# Patient Record
Sex: Female | Born: 1995 | Race: Asian | Hispanic: No | Marital: Single | Smoking: Never smoker
Health system: Southern US, Community
[De-identification: ages and names within clinical notes are randomized; demographics above are authoritative.]

## PROBLEM LIST (undated history)

## (undated) ENCOUNTER — Inpatient Hospital Stay (HOSPITAL_COMMUNITY): Payer: Self-pay

## (undated) DIAGNOSIS — J45909 Unspecified asthma, uncomplicated: Secondary | ICD-10-CM

## (undated) DIAGNOSIS — O24419 Gestational diabetes mellitus in pregnancy, unspecified control: Secondary | ICD-10-CM

## (undated) HISTORY — PX: NO PAST SURGERIES: SHX2092

## (undated) HISTORY — DX: Gestational diabetes mellitus in pregnancy, unspecified control: O24.419

---

## 2014-06-24 NOTE — L&D Delivery Note (Signed)
Vaginal Delivery Note The pt utilized an epidural as pain management.   Spontaneous rupture of membranes today, at 1000, clear.  GBS was negative.  Cervical dilation was complete at  2004.  NICHD Category 1.    Pushing with guidance began at  2020.   After 1 hour(s) and  12 minutes of pushing the head, shoulders and the body of a viable female infant "Maison" delivered spontaneously in ROP with manual restitution and maternal effort in the to ROT position at 2132  With vigorous tone and spontaneous cry, the infant was placed on moms abd.  After the umbilical cord was clamped it was cut by the FOB, then cord blood was obtained for evaluation.  Spontaneous delivery of a intact placenta with a 3 vessel cord via Shultz at 2146.   Episiotomy: None   The vulva, perineum, vaginal vault, rectum and cervix were inspected and revealed a 1st degree vaginal, repaired using a 3-0 vicryl on a CT needle and a superficial right periurethral laceration and periclitoral which was repaired using a 4-0 vicryl on a SH needle. Lidocaine was not used, the epidural was sufficient for the repair.  Patient tolerated repair well.   Postpartum pitocin as ordered.  Fundus firm, lochia minimum, bleeding under control. EBL 100, Pt hemodynamically stable.   Sponge, laps and needle count correct and verified with the primary care nurse.  Attending MD available at all times.    Routine postpartum orders   Mother unsure about method of contraception  Mom plans to breastfeed   Infant to have out patient circumcision   Placenta to pathology: NO     Cord Gases sent to lab: NO Cord blood sent to lab: YES   APGARS:  8 at 1 minute and 9 at 5 minutes Weight:. pending     Both mom and baby were left in stable condition, baby skin to skin.      Aleese Kamps, CNM, MSN 06/06/2015. 10:39 PM

## 2014-07-11 ENCOUNTER — Encounter (HOSPITAL_COMMUNITY): Payer: Self-pay

## 2014-07-11 ENCOUNTER — Emergency Department (HOSPITAL_COMMUNITY): Payer: BLUE CROSS/BLUE SHIELD

## 2014-07-11 ENCOUNTER — Emergency Department (HOSPITAL_COMMUNITY)
Admission: EM | Admit: 2014-07-11 | Discharge: 2014-07-12 | Disposition: A | Discharge status: Home / Self Care | Payer: BLUE CROSS/BLUE SHIELD | Attending: Emergency Medicine | Admitting: Emergency Medicine

## 2014-07-11 DIAGNOSIS — Z3202 Encounter for pregnancy test, result negative: Secondary | ICD-10-CM | POA: Diagnosis not present

## 2014-07-11 DIAGNOSIS — Y998 Other external cause status: Secondary | ICD-10-CM | POA: Diagnosis not present

## 2014-07-11 DIAGNOSIS — S8991XA Unspecified injury of right lower leg, initial encounter: Secondary | ICD-10-CM | POA: Diagnosis not present

## 2014-07-11 DIAGNOSIS — Y92410 Unspecified street and highway as the place of occurrence of the external cause: Secondary | ICD-10-CM | POA: Insufficient documentation

## 2014-07-11 DIAGNOSIS — J45909 Unspecified asthma, uncomplicated: Secondary | ICD-10-CM | POA: Insufficient documentation

## 2014-07-11 DIAGNOSIS — Y9389 Activity, other specified: Secondary | ICD-10-CM | POA: Insufficient documentation

## 2014-07-11 DIAGNOSIS — S8992XA Unspecified injury of left lower leg, initial encounter: Secondary | ICD-10-CM | POA: Diagnosis not present

## 2014-07-11 HISTORY — DX: Unspecified asthma, uncomplicated: J45.909

## 2014-07-11 LAB — POC URINE PREG, ED: Preg Test, Ur: NEGATIVE

## 2014-07-11 MED ORDER — HYDROCODONE-ACETAMINOPHEN 5-325 MG PO TABS: 2.0000 | ORAL_TABLET | Freq: Four times a day (QID) | ORAL | Status: DC | PRN

## 2014-07-11 MED ORDER — IBUPROFEN 600 MG PO TABS: 600.0000 mg | ORAL_TABLET | Freq: Four times a day (QID) | ORAL | Status: DC | PRN

## 2014-07-11 MED ORDER — IBUPROFEN 200 MG PO TABS
600.0000 mg | ORAL_TABLET | Freq: Once | ORAL | Status: AC
  Administered 2014-07-11: 600 mg via ORAL
  Filled 2014-07-11: qty 3

## 2014-07-11 MED ORDER — HYDROCODONE-ACETAMINOPHEN 5-325 MG PO TABS
2.0000 | ORAL_TABLET | Freq: Once | ORAL | Status: AC
  Administered 2014-07-11: 2 via ORAL
  Filled 2014-07-11: qty 2

## 2014-07-11 NOTE — Discharge Instructions (Signed)
Take motrin 600 mg every 6 hrs for pain.   Take vicodin for severe pain. Do NOT drive with it.   Use crutches for comfort. Apply ice to your leg.   You will be stiff and sore tomorrow. The bruising may get worse.   Follow up with your doctor.   Return to ER if you have worse leg pain, headaches, vomiting.
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
## 2015-02-01 LAB — OB RESULTS CONSOLE ABO/RH
RH Type: NEGATIVE
RH Type: POSITIVE

## 2015-02-01 LAB — OB RESULTS CONSOLE HEPATITIS B SURFACE ANTIGEN: Hepatitis B Surface Ag: NEGATIVE

## 2015-02-01 LAB — OB RESULTS CONSOLE RPR: RPR: NONREACTIVE

## 2015-02-01 LAB — OB RESULTS CONSOLE ANTIBODY SCREEN: ANTIBODY SCREEN: NEGATIVE

## 2015-02-01 LAB — OB RESULTS CONSOLE HIV ANTIBODY (ROUTINE TESTING): HIV: NONREACTIVE

## 2015-02-01 LAB — OB RESULTS CONSOLE RUBELLA ANTIBODY, IGM: RUBELLA: NON-IMMUNE/NOT IMMUNE

## 2015-04-01 ENCOUNTER — Encounter (HOSPITAL_COMMUNITY): Payer: Self-pay | Admitting: *Deleted

## 2015-04-01 ENCOUNTER — Inpatient Hospital Stay (HOSPITAL_COMMUNITY)
Admission: AD | Admit: 2015-04-01 | Discharge: 2015-04-01 | Disposition: A | Discharge status: Home / Self Care | Payer: BLUE CROSS/BLUE SHIELD | Source: Ambulatory Visit | Attending: Obstetrics & Gynecology | Admitting: Obstetrics & Gynecology

## 2015-04-01 DIAGNOSIS — Z3A30 30 weeks gestation of pregnancy: Secondary | ICD-10-CM | POA: Insufficient documentation

## 2015-04-01 DIAGNOSIS — O4703 False labor before 37 completed weeks of gestation, third trimester: Secondary | ICD-10-CM | POA: Insufficient documentation

## 2015-04-01 DIAGNOSIS — Z3483 Encounter for supervision of other normal pregnancy, third trimester: Secondary | ICD-10-CM

## 2015-04-01 LAB — URINALYSIS, ROUTINE W REFLEX MICROSCOPIC
Bilirubin Urine: NEGATIVE
Glucose, UA: NEGATIVE mg/dL
HGB URINE DIPSTICK: NEGATIVE
Ketones, ur: NEGATIVE mg/dL
Leukocytes, UA: NEGATIVE
NITRITE: NEGATIVE
PH: 7 (ref 5.0–8.0)
Protein, ur: NEGATIVE mg/dL
SPECIFIC GRAVITY, URINE: 1.025 (ref 1.005–1.030)
UROBILINOGEN UA: 0.2 mg/dL (ref 0.0–1.0)

## 2015-04-01 NOTE — MAU Provider Note (Signed)
Teresa Brown is a 19 y.o. G1P0 at 30.0 weeks c/o ctx q 2 since 1430. She denies vb or lof w+FM.     History     There are no active problems to display for this patient.   Chief Complaint  Patient presents with  . Abdominal Pain   HPI  OB History    Gravida Para Term Preterm AB TAB SAB Ectopic Multiple Living   1 0        0      Past Medical History  Diagnosis Date  . Asthma     Past Surgical History  Procedure Laterality Date  . No past surgeries      History reviewed. No pertinent family history.  Social History  Substance Use Topics  . Smoking status: Never Smoker   . Smokeless tobacco: None  . Alcohol Use: No    Allergies: No Known Allergies  Prescriptions prior to admission  Medication Sig Dispense Refill Last Dose  . Prenatal Vit-Fe Fumarate-FA (PRENATAL MULTIVITAMIN) TABS tablet Take 1 tablet by mouth daily.   Past Week at Unknown time    ROS See HPI above, all other systems are negative  Physical Exam   Blood pressure 115/63, pulse 77, temperature 98 F (36.7 C), temperature source Oral, resp. rate 16, height  (1.575 m), weight 153 lb (69.4 kg), last menstrual period 09/03/2014.  Physical Exam Ext:  WNL ABD: Soft, non tender to palpation, no rebound or guarding SVE:   ED Course  Assessment: IUP at  30.0weeks Membranes: intact FHR: Category 1 CTX:  none 1 hr observation  MAU Addendum Note  Results for orders placed or performed during the hospital encounter of 04/01/15 (from the past 24 hour(s))  Urinalysis, Routine w reflex microscopic (not at [facility])     Status: None   Collection Time: 04/01/15  4:20 PM  Result Value Ref Range   Color, Urine YELLOW YELLOW   APPearance CLEAR CLEAR   Specific Gravity, Urine 1.025 1.005 - 1.030   pH 7.0 5.0 - 8.0   Glucose, UA NEGATIVE NEGATIVE mg/dL   Hgb urine dipstick NEGATIVE NEGATIVE   Bilirubin Urine NEGATIVE NEGATIVE   Ketones, ur NEGATIVE NEGATIVE mg/dL   Protein, ur NEGATIVE NEGATIVE  mg/dL   Urobilinogen, UA 0.2 0.0 - 1.0 mg/dL   Nitrite NEGATIVE NEGATIVE   Leukocytes, UA NEGATIVE NEGATIVE   Braxton hick reviewed   Plan: -Discussed need to follow up in office  -Bleeding and PTL Precautions -Encouraged to call if any questions or concerns arise prior to next scheduled office visit.  -Discharged to home in stable condition Braxton hicks   Evola Hollis, CNM, MSN 04/01/2015. 5:43 PM

## 2015-04-01 NOTE — MAU Note (Signed)
Patient presents at [redacted] weeks gestation with c/o abdominal cramping every 2 minutes since 1430. Fetus active. Denies bleeding or discharge.

## 2015-04-01 NOTE — Discharge Instructions (Signed)
Fetal Movement Counts Patient Name: __________________________________________________ Patient Due Date: ____________________ Performing a fetal movement count is highly recommended in high-risk pregnancies, but it is good for every pregnant woman to do. Your health care provider may ask you to start counting fetal movements at 28 weeks of the pregnancy. Fetal movements often increase:  After eating a full meal.  After physical activity.  After eating or drinking something sweet or cold.  At rest. Pay attention to when you feel the baby is most active. This will help you notice a pattern of your baby's sleep and wake cycles and what factors contribute to an increase in fetal movement. It is important to perform a fetal movement count at the same time each day when your baby is normally most active.  HOW TO COUNT FETAL MOVEMENTS 1. Find a quiet and comfortable area to sit or lie down on your left side. Lying on your left side provides the best blood and oxygen circulation to your baby. 2. Write down the day and time on a sheet of paper or in a journal. 3. Start counting kicks, flutters, swishes, rolls, or jabs in a 2-hour period. You should feel at least 10 movements within 2 hours. 4. If you do not feel 10 movements in 2 hours, wait 2-3 hours and count again. Look for a change in the pattern or not enough counts in 2 hours. SEEK MEDICAL CARE IF:  You feel less than 10 counts in 2 hours, tried twice.  There is no movement in over an hour.  The pattern is changing or taking longer each day to reach 10 counts in 2 hours.  You feel the baby is not moving as he or she usually does. Date: ____________ Movements: ____________ Start time: ____________ Finish time: ____________  Date: ____________ Movements: ____________ Start time: ____________ Finish time: ____________ Date: ____________ Movements: ____________ Start time: ____________ Finish time: ____________ Date: ____________ Movements:  ____________ Start time: ____________ Finish time: ____________ Date: ____________ Movements: ____________ Start time: ____________ Finish time: ____________ Date: ____________ Movements: ____________ Start time: ____________ Finish time: ____________ Date: ____________ Movements: ____________ Start time: ____________ Finish time: ____________ Date: ____________ Movements: ____________ Start time: ____________ Finish time: ____________  Date: ____________ Movements: ____________ Start time: ____________ Finish time: ____________ Date: ____________ Movements: ____________ Start time: ____________ Finish time: ____________ Date: ____________ Movements: ____________ Start time: ____________ Finish time: ____________ Date: ____________ Movements: ____________ Start time: ____________ Finish time: ____________ Date: ____________ Movements: ____________ Start time: ____________ Finish time: ____________ Date: ____________ Movements: ____________ Start time: ____________ Finish time: ____________ Date: ____________ Movements: ____________ Start time: ____________ Finish time: ____________  Date: ____________ Movements: ____________ Start time: ____________ Finish time: ____________ Date: ____________ Movements: ____________ Start time: ____________ Finish time: ____________ Date: ____________ Movements: ____________ Start time: ____________ Finish time: ____________ Date: ____________ Movements: ____________ Start time: ____________ Finish time: ____________ Date: ____________ Movements: ____________ Start time: ____________ Finish time: ____________ Date: ____________ Movements: ____________ Start time: ____________ Finish time: ____________ Date: ____________ Movements: ____________ Start time: ____________ Finish time: ____________  Date: ____________ Movements: ____________ Start time: ____________ Finish time: ____________ Date: ____________ Movements: ____________ Start time: ____________ Finish  time: ____________ Date: ____________ Movements: ____________ Start time: ____________ Finish time: ____________ Date: ____________ Movements: ____________ Start time: ____________ Finish time: ____________ Date: ____________ Movements: ____________ Start time: ____________ Finish time: ____________ Date: ____________ Movements: ____________ Start time: ____________ Finish time: ____________ Date: ____________ Movements: ____________ Start time: ____________ Finish time: ____________  Date: ____________ Movements: ____________ Start time: ____________ Finish   time: ____________ Date: ____________ Movements: ____________ Start time: ____________ Finish time: ____________ Date: ____________ Movements: ____________ Start time: ____________ Finish time: ____________ Date: ____________ Movements: ____________ Start time: ____________ Finish time: ____________ Date: ____________ Movements: ____________ Start time: ____________ Finish time: ____________ Date: ____________ Movements: ____________ Start time: ____________ Finish time: ____________ Date: ____________ Movements: ____________ Start time: ____________ Finish time: ____________  Date: ____________ Movements: ____________ Start time: ____________ Finish time: ____________ Date: ____________ Movements: ____________ Start time: ____________ Finish time: ____________ Date: ____________ Movements: ____________ Start time: ____________ Finish time: ____________ Date: ____________ Movements: ____________ Start time: ____________ Finish time: ____________ Date: ____________ Movements: ____________ Start time: ____________ Finish time: ____________ Date: ____________ Movements: ____________ Start time: ____________ Finish time: ____________ Date: ____________ Movements: ____________ Start time: ____________ Finish time: ____________  Date: ____________ Movements: ____________ Start time: ____________ Finish time: ____________ Date: ____________  Movements: ____________ Start time: ____________ Finish time: ____________ Date: ____________ Movements: ____________ Start time: ____________ Finish time: ____________ Date: ____________ Movements: ____________ Start time: ____________ Finish time: ____________ Date: ____________ Movements: ____________ Start time: ____________ Finish time: ____________ Date: ____________ Movements: ____________ Start time: ____________ Finish time: ____________ Date: ____________ Movements: ____________ Start time: ____________ Finish time: ____________  Date: ____________ Movements: ____________ Start time: ____________ Finish time: ____________ Date: ____________ Movements: ____________ Start time: ____________ Finish time: ____________ Date: ____________ Movements: ____________ Start time: ____________ Finish time: ____________ Date: ____________ Movements: ____________ Start time: ____________ Finish time: ____________ Date: ____________ Movements: ____________ Start time: ____________ Finish time: ____________ Date: ____________ Movements: ____________ Start time: ____________ Finish time: ____________   This information is not intended to replace advice given to you by your health care provider. Make sure you discuss any questions you have with your health care provider.   Document Released: 07/10/2006 Document Revised: 07/01/2014 Document Reviewed: 04/06/2012 Elsevier Interactive Patient Education 2016 [employer] Braxton Hicks Contractions Contractions of the uterus can occur throughout pregnancy. Contractions are not always a sign that you are in labor.  WHAT ARE BRAXTON HICKS CONTRACTIONS?  Contractions that occur before labor are called Braxton Hicks contractions, or false labor. Toward the end of pregnancy (32-34 weeks), these contractions can develop more often and may become more forceful. This is not true labor because these contractions do not result in opening (dilatation) and thinning of  the cervix. They are sometimes difficult to tell apart from true labor because these contractions can be forceful and people have different pain tolerances. You should not feel embarrassed if you go to the hospital with false labor. Sometimes, the only way to tell if you are in true labor is for your health care provider to look for changes in the cervix. If there are no prenatal problems or other health problems associated with the pregnancy, it is completely safe to be sent home with false labor and await the onset of true labor. HOW CAN YOU TELL THE DIFFERENCE BETWEEN TRUE AND FALSE LABOR? False Labor  The contractions of false labor are usually shorter and not as hard as those of true labor.   The contractions are usually irregular.   The contractions are often felt in the front of the lower abdomen and in the groin.   The contractions may go away when you walk around or change positions while lying down.   The contractions get weaker and are shorter lasting as time goes on.   The contractions do not usually become progressively stronger, regular, and closer together as with true labor.  True Labor 5. Contractions in true   labor last 30-70 seconds, become very regular, usually become more intense, and increase in frequency.  6. The contractions do not go away with walking.  7. The discomfort is usually felt in the top of the uterus and spreads to the lower abdomen and low back.  8. True labor can be determined by your health care provider with an exam. This will show that the cervix is dilating and getting thinner.  WHAT TO REMEMBER  Keep up with your usual exercises and follow other instructions given by your health care provider.   Take medicines as directed by your health care provider.   Keep your regular prenatal appointments.   Eat and drink lightly if you think you are going into labor.   If Braxton Hicks contractions are making you uncomfortable:   Change  your position from lying down or resting to walking, or from walking to resting.   Sit and rest in a tub of warm water.   Drink 2-3 glasses of water. Dehydration may cause these contractions.   Do slow and deep breathing several times an hour.  WHEN SHOULD I SEEK IMMEDIATE MEDICAL CARE? Seek immediate medical care if:  Your contractions become stronger, more regular, and closer together.   You have fluid leaking or gushing from your vagina.   You have a fever.   You pass blood-tinged mucus.   You have vaginal bleeding.   You have continuous abdominal pain.   You have low back pain that you never had before.   You feel your baby's head pushing down and causing pelvic pressure.   Your baby is not moving as much as it used to.    This information is not intended to replace advice given to you by your health care provider. Make sure you discuss any questions you have with your health care provider.   Document Released: 06/10/2005 Document Revised: 06/15/2013 Document Reviewed: 03/22/2013 Elsevier Interactive Patient Education 2016 [employer] Inc[employer]  

## 2015-04-05 ENCOUNTER — Encounter: Payer: BLUE CROSS/BLUE SHIELD | Attending: Family Medicine

## 2015-04-05 VITALS — Ht 62.0 in | Wt 157.8 lb

## 2015-04-05 DIAGNOSIS — O24419 Gestational diabetes mellitus in pregnancy, unspecified control: Secondary | ICD-10-CM | POA: Diagnosis not present

## 2015-04-05 DIAGNOSIS — Z713 Dietary counseling and surveillance: Secondary | ICD-10-CM | POA: Insufficient documentation

## 2015-04-11 NOTE — Progress Notes (Signed)
  Patient was seen on 04/05/15 for Gestational Diabetes self-management . The following learning objectives were met by the patient :   States the definition of Gestational Diabetes  States why dietary management is important in controlling blood glucose  Describes the effects of carbohydrates on blood glucose levels  Demonstrates ability to create a balanced meal plan  Demonstrates carbohydrate counting   States when to check blood glucose levels  Demonstrates proper blood glucose monitoring techniques  States the effect of stress and exercise on blood glucose levels  States the importance of limiting caffeine and abstaining from alcohol and smoking  Plan:  Aim for 2 Carb Choices per meal (30 grams) +/- 1 either way for breakfast Aim for 3 Carb Choices per meal (45 grams) +/- 1 either way from lunch and dinner Aim for 1-2 Carbs per snack Begin reading food labels for Total Carbohydrate and sugar grams of foods Consider  increasing your activity level by walking daily as tolerated Begin checking BG before breakfast and 1-2 hours after first bit of breakfast, lunch and dinner after  as directed by MD  Take medication  as directed by MD  Blood glucose monitor given: One Touch Verio Flex Lot # [id number] X Exp: 05/2016 Blood glucose reading: $RemoveBeforeDE'86mg'[id number]$ /dl  Patient instructed to monitor glucose levels: FBS: 60 - <90 1 hour: <140 2 hour: <120  Patient received the following handouts:  Nutrition Diabetes and Pregnancy  Carbohydrate Counting List  Meal Planning worksheet  Patient will be seen for follow-up as needed.

## 2015-05-23 LAB — OB RESULTS CONSOLE GBS: GBS: NEGATIVE

## 2015-06-06 ENCOUNTER — Inpatient Hospital Stay (HOSPITAL_COMMUNITY): Payer: Medicaid Other | Admitting: Anesthesiology

## 2015-06-06 ENCOUNTER — Inpatient Hospital Stay (HOSPITAL_COMMUNITY)
Admission: AD | Admit: 2015-06-06 | Discharge: 2015-06-09 | DRG: 774 | Disposition: A | Discharge status: Home / Self Care | Payer: Medicaid Other | Source: Ambulatory Visit | Attending: Obstetrics and Gynecology | Admitting: Obstetrics and Gynecology

## 2015-06-06 ENCOUNTER — Encounter (HOSPITAL_COMMUNITY): Payer: Self-pay

## 2015-06-06 DIAGNOSIS — Z3A39 39 weeks gestation of pregnancy: Secondary | ICD-10-CM

## 2015-06-06 DIAGNOSIS — D649 Anemia, unspecified: Secondary | ICD-10-CM | POA: Diagnosis present

## 2015-06-06 DIAGNOSIS — Z823 Family history of stroke: Secondary | ICD-10-CM

## 2015-06-06 DIAGNOSIS — O24419 Gestational diabetes mellitus in pregnancy, unspecified control: Secondary | ICD-10-CM | POA: Diagnosis present

## 2015-06-06 DIAGNOSIS — J45909 Unspecified asthma, uncomplicated: Secondary | ICD-10-CM | POA: Diagnosis present

## 2015-06-06 DIAGNOSIS — O9081 Anemia of the puerperium: Secondary | ICD-10-CM | POA: Diagnosis present

## 2015-06-06 DIAGNOSIS — O9952 Diseases of the respiratory system complicating childbirth: Secondary | ICD-10-CM | POA: Diagnosis present

## 2015-06-06 DIAGNOSIS — O1490 Unspecified pre-eclampsia, unspecified trimester: Secondary | ICD-10-CM | POA: Diagnosis not present

## 2015-06-06 DIAGNOSIS — O24420 Gestational diabetes mellitus in childbirth, diet controlled: Secondary | ICD-10-CM | POA: Diagnosis present

## 2015-06-06 DIAGNOSIS — O1405 Mild to moderate pre-eclampsia, complicating the puerperium: Secondary | ICD-10-CM | POA: Diagnosis not present

## 2015-06-06 DIAGNOSIS — Z833 Family history of diabetes mellitus: Secondary | ICD-10-CM | POA: Diagnosis not present

## 2015-06-06 DIAGNOSIS — O0930 Supervision of pregnancy with insufficient antenatal care, unspecified trimester: Secondary | ICD-10-CM

## 2015-06-06 LAB — PROTEIN / CREATININE RATIO, URINE
CREATININE, URINE: 80 mg/dL
PROTEIN CREATININE RATIO: 0.84 mg/mg{creat} — AB (ref 0.00–0.15)
TOTAL PROTEIN, URINE: 67 mg/dL

## 2015-06-06 LAB — CBC
HCT: 38.4 % (ref 36.0–46.0)
Hemoglobin: 12.1 g/dL (ref 12.0–15.0)
MCH: 22 pg — AB (ref 26.0–34.0)
MCHC: 31.5 g/dL (ref 30.0–36.0)
MCV: 69.7 fL — AB (ref 78.0–100.0)
PLATELETS: 156 10*3/uL (ref 150–400)
RBC: 5.51 MIL/uL — ABNORMAL HIGH (ref 3.87–5.11)
RDW: 15 % (ref 11.5–15.5)
WBC: 9.4 10*3/uL (ref 4.0–10.5)

## 2015-06-06 LAB — GLUCOSE, CAPILLARY
Glucose-Capillary: 90 mg/dL (ref 65–99)
Glucose-Capillary: 82 mg/dL (ref 65–99)

## 2015-06-06 LAB — ABO/RH: ABO/RH(D): B POS

## 2015-06-06 MED ORDER — FLEET ENEMA 7-19 GM/118ML RE ENEM: 1.0000 | ENEMA | RECTAL | Status: DC | PRN

## 2015-06-06 MED ORDER — FENTANYL CITRATE (PF) 100 MCG/2ML IJ SOLN: 100.0000 ug | INTRAMUSCULAR | Status: DC | PRN

## 2015-06-06 MED ORDER — ONDANSETRON HCL 4 MG/2ML IJ SOLN: 4.0000 mg | Freq: Four times a day (QID) | INTRAMUSCULAR | Status: DC | PRN

## 2015-06-06 MED ORDER — LACTATED RINGERS IV SOLN
500.0000 mL | INTRAVENOUS | Status: DC | PRN
  Administered 2015-06-06: 500 mL via INTRAVENOUS

## 2015-06-06 MED ORDER — LIDOCAINE HCL (PF) 1 % IJ SOLN
30.0000 mL | INTRAMUSCULAR | Status: DC | PRN
  Filled 2015-06-06: qty 30

## 2015-06-06 MED ORDER — LIDOCAINE HCL (PF) 1 % IJ SOLN
INTRAMUSCULAR | Status: DC | PRN
  Administered 2015-06-06: 6 mL via EPIDURAL
  Administered 2015-06-06: 8 mL via EPIDURAL

## 2015-06-06 MED ORDER — LACTATED RINGERS IV SOLN
INTRAVENOUS | Status: DC
  Administered 2015-06-06 (×2): via INTRAVENOUS

## 2015-06-06 MED ORDER — EPHEDRINE 5 MG/ML INJ
10.0000 mg | INTRAVENOUS | Status: DC | PRN
  Filled 2015-06-06: qty 2

## 2015-06-06 MED ORDER — TERBUTALINE SULFATE 1 MG/ML IJ SOLN
0.2500 mg | Freq: Once | INTRAMUSCULAR | Status: DC | PRN
  Filled 2015-06-06: qty 1

## 2015-06-06 MED ORDER — OXYTOCIN 40 UNITS IN LACTATED RINGERS INFUSION - SIMPLE MED
1.0000 m[IU]/min | INTRAVENOUS | Status: DC
  Administered 2015-06-06: 1 m[IU]/min via INTRAVENOUS

## 2015-06-06 MED ORDER — OXYTOCIN BOLUS FROM INFUSION: 500.0000 mL | INTRAVENOUS | Status: DC

## 2015-06-06 MED ORDER — OXYTOCIN 40 UNITS IN LACTATED RINGERS INFUSION - SIMPLE MED
62.5000 mL/h | INTRAVENOUS | Status: DC
  Filled 2015-06-06: qty 1000

## 2015-06-06 MED ORDER — ACETAMINOPHEN 325 MG PO TABS: 650.0000 mg | ORAL_TABLET | ORAL | Status: DC | PRN

## 2015-06-06 MED ORDER — OXYCODONE-ACETAMINOPHEN 5-325 MG PO TABS: 2.0000 | ORAL_TABLET | ORAL | Status: DC | PRN

## 2015-06-06 MED ORDER — FENTANYL 2.5 MCG/ML BUPIVACAINE 1/10 % EPIDURAL INFUSION (WH - ANES)
14.0000 mL/h | INTRAMUSCULAR | Status: DC | PRN
Start: 2015-06-06 — End: 2015-06-07
  Administered 2015-06-06: 14 mL/h via EPIDURAL
  Filled 2015-06-06: qty 125

## 2015-06-06 MED ORDER — CITRIC ACID-SODIUM CITRATE 334-500 MG/5ML PO SOLN: 30.0000 mL | ORAL | Status: DC | PRN

## 2015-06-06 MED ORDER — PHENYLEPHRINE 40 MCG/ML (10ML) SYRINGE FOR IV PUSH (FOR BLOOD PRESSURE SUPPORT)
80.0000 ug | PREFILLED_SYRINGE | INTRAVENOUS | Status: DC | PRN
  Filled 2015-06-06: qty 2
  Filled 2015-06-06: qty 20

## 2015-06-06 MED ORDER — OXYCODONE-ACETAMINOPHEN 5-325 MG PO TABS: 1.0000 | ORAL_TABLET | ORAL | Status: DC | PRN

## 2015-06-06 MED ORDER — DIPHENHYDRAMINE HCL 50 MG/ML IJ SOLN: 12.5000 mg | INTRAMUSCULAR | Status: DC | PRN

## 2015-06-06 NOTE — Anesthesia Preprocedure Evaluation (Signed)
Anesthesia Evaluation  Patient identified by MRN, date of birth, ID band Patient awake    Reviewed: Allergy & Precautions, H&P , NPO status , Patient's Chart, lab work & pertinent test results  Airway Mallampati: I  TM Distance: >3 FB Neck ROM: full    Dental no notable dental hx. (+) Teeth Intact   Pulmonary    Pulmonary exam normal        Cardiovascular negative cardio ROS Normal cardiovascular exam     Neuro/Psych negative neurological ROS  negative psych ROS   GI/Hepatic negative GI ROS, Neg liver ROS,   Endo/Other  diabetes, Gestational  Renal/GU negative Renal ROS     Musculoskeletal   Abdominal Normal abdominal exam  (+)   Peds  Hematology negative hematology ROS (+)   Anesthesia Other Findings   Reproductive/Obstetrics negative OB ROS (+) Pregnancy                             Anesthesia Physical Anesthesia Plan  ASA: II  Anesthesia Plan: Epidural   Post-op Pain Management:    Induction:   Airway Management Planned:   Additional Equipment:   Intra-op Plan:   Post-operative Plan:   Informed Consent: I have reviewed the patients History and Physical, chart, labs and discussed the procedure including the risks, benefits and alternatives for the proposed anesthesia with the patient or authorized representative who has indicated his/her understanding and acceptance.     Plan Discussed with: CRNA  Anesthesia Plan Comments:         Anesthesia Quick Evaluation

## 2015-06-06 NOTE — H&P (Signed)
Teresa BaronSonyta Brown is a 19 y.o. female, G1P0 at 5439 3/7 weeks, presenting for SROM at 10am, clear fluid, with onset of UCs around noon.  Seen at office around 11am, with SROM verified, and instructed to come to office when UCs q 5 min.  Reports +FM, denies bleeding.  No recent VE, last exam at 35 weeks, with cervix 2 cm.  Patient Active Problem List   Diagnosis Date Noted  . Normal labor 06/06/2015  . Late prenatal care 06/06/2015  . Gestational diabetes--diet controlled 06/06/2015    History of present pregnancy: Patient entered care at 21 4/7 weeks.   EDC of 06/09/16 was established by LMP and in agreement with US at 22 6/7 weeks.   Anatomy scan:  22 6/7 weeks, with normal findings and an posterior fundal placenta.   Additional US evaluations:   35 5/7 weeks:  EFW 2648 gm, 5+13, 63%ile, AFI 19.05,  EFW 3127 g, 50%ile, AFI 14.40, FL at 4%ile. Significant prenatal events:  Entered care at 21 weeks due to insurance issues.  TDAP 05/11/15, declined flu vaccine.  Elevated 1 hour GTT, 3 of 4 values on 3 hour GTT abnormal at 28 5/7 weeks.  CBGs remained stable during pregnancy, under dietary control.  2 cm, 50% at 35 5/7 weeks.  BP 112/72 on 12/8. Last evaluation:  06/06/15--evaluated for SROM, no UCs.  VE deferred.  OB History    Gravida Para Term Preterm AB TAB SAB Ectopic Multiple Living   1 0        0     Past Medical History  Diagnosis Date  . Asthma   . Gestational diabetes    Past Surgical History  Procedure Laterality Date  . No past surgeries     Family History: Strong family hx of diabetes--Mother, MA, MU, MGM; MGM CVA  Social History:  reports that she has never smoked. She does not have any smokeless tobacco history on file. She reports that she does not drink alcohol or use illicit drugs.  Patient is Asian, of the Buddist faith, 2 years of college, employed as Conservation officer, naturecashier.  FOB, Maren Beachric Rea, is present and supportive.   Prenatal Transfer Tool  Maternal Diabetes: Yes:  Diabetes Type:   Diet controlled Genetic Screening: Normal Quad screen Maternal Ultrasounds/Referrals: Normal Fetal Ultrasounds or other Referrals:  None Maternal Substance Abuse:  No Significant Maternal Medications:  None Significant Maternal Lab Results: Lab values include: Group B Strep negative  TDAP 05/11/15 Flu NA  ROS:  Leaking clear fluid, contractions, +FM  No Known Allergies   Dilation: 7 Station: -1 Exam by:: Emilee HeroLatham, CNM Blood pressure 142/81, pulse 70, temperature 98 F (36.7 C), temperature source Oral, resp. rate 16, height 5\' 2"  (1.575 m), weight 77.111 kg (170 lb), last menstrual period 09/03/2014.  Chest clear Heart RRR without murmur Abd gravid, NT, FH 39 cm Pelvic: As above, leaking clear fluid, pelvis feels adequate Ext: WNL  FHR: Category 2--no decels, 10 beat accels, decreased variability UCs:  q 5 min  Prenatal labs: ABO, Rh: B/Negative/-- (08/10 0000) Antibody: Negative (08/10 0000) Rubella:  Non-immune RPR: Nonreactive (08/10 0000)  HBsAg: Negative (08/10 0000)  HIV: Non-reactive (08/10 0000)  GBS:  Negative 05/23/15 Sickle cell/Hgb electrophoresis:  NA Pap:  NA GC:  05/11/15 negative Chlamydia:  05/11/15 negative Genetic screenings:  Normal Quad screen Glucola:  Elevated, 190.  3 hour GTT with 3 of 4 elevated. Other:   Hgb 11.7 at NOB, 10.5 at 28 weeks   Assessment/Plan: IUP  at 39 3/7 weeks Active labor GBS negative GBS--diet-controlled Rubella non-immune Asthma Late prenatal care  Plan: Admit to [facility] per consult with Dr. Estanislado Pandy Routine [facility] orders Pain med/epidural prn CBG q 4 hours.  Tita Terhaar, VICKICNM, [place] 06/06/2015, 1:50 PM

## 2015-06-06 NOTE — Anesthesia Procedure Notes (Signed)
Epidural Patient location during procedure: OB Start time: 06/06/2015 1:57 PM End time: 06/06/2015 2:01 PM  Staffing Anesthesiologist: Leilani AbleHATCHETT, Akiba Melfi Performed by: anesthesiologist   Preanesthetic Checklist Completed: patient identified, surgical consent, pre-op evaluation, timeout performed, IV checked, risks and benefits discussed and monitors and equipment checked  Epidural Patient position: sitting Prep: site prepped and draped and DuraPrep Patient monitoring: continuous pulse ox and blood pressure Approach: midline Location: L3-L4 Injection technique: LOR air  Needle:  Needle type: Tuohy  Needle gauge: 17 G Needle length: 9 cm and 9 Needle insertion depth: 5 cm cm Catheter type: closed end flexible Catheter size: 19 Gauge Catheter at skin depth: 10 cm Test dose: negative and Other  Assessment Sensory level: T9 Events: blood not aspirated, injection not painful, no injection resistance, negative IV test and no paresthesia  Additional Notes Reason for block:procedure for pain

## 2015-06-06 NOTE — Progress Notes (Addendum)
  Subjective: Comfortable with epidural.    Objective: BP 138/82 mmHg  Pulse 70  Temp(Src) 98.4 F (36.9 C) (Oral)  Resp 16  Ht 5\' 2"  (1.575 m)  Wt 77.111 kg (170 lb)  BMI 31.09 kg/m2  LMP 09/03/2014      Filed Vitals:   06/06/15 1431 06/06/15 1501 06/06/15 1531 06/06/15 1602  BP: 118/84 131/82 146/94 138/82  Pulse: 87 68 81 70  Temp: 97.4 F (36.3 C)   98.4 F (36.9 C)  TempSrc: Oral   Oral  Resp: 18 18 18 16   Height:      Weight:        FHT: Category 1--segments of decreased variability, apparent sleep cycles, then segments of Category 1, good response to scalp stim UC:   regular, every 5 minutes SVE:   Dilation: 8 Effacement (%): 80 Station: -1 Exam by:: Emilee HeroLatham, CNM  IUPC placed without difficulty   Assessment:  Active labor Mild elevation of BP  Plan: Observe quality of UCs--augment if inadequate labor Check PCR. Dr. Estanislado Pandyivard updated on labor status.  Nigel BridgemanLATHAM, Nina Hoar CNM 06/06/2015, 4:11 PM

## 2015-06-06 NOTE — Progress Notes (Signed)
  Subjective: Comfortable with epidural.  Mother, sister, and FOB at bedside.  Objective: BP 128/84 mmHg  Pulse 70  Temp(Src) 98.4 F (36.9 C) (Oral)  Resp 18  Ht 5\' 2"  (1.575 m)  Wt 77.111 kg (170 lb)  BMI 31.09 kg/m2  LMP 09/03/2014   Total I/O In: -  Out: 900 [Urine:900]   Filed Vitals:   06/06/15 1602 06/06/15 1632 06/06/15 1701 06/06/15 1731  BP: 138/82 104/69 114/75 128/84  Pulse: 70 77 79 70  Temp: 98.4 F (36.9 C)     TempSrc: Oral     Resp: 16 16 16 18   Height:      Weight:        FHT: Category 1 UC:   regular, every 5 minutes SVE:   Dilation: 8.5 Effacement (%): 100 Station: 0 Exam by:: Emilee HeroLatham, CNM  Vtx lower in pelvis, good thrust with UCs MVUs 50-70 IUPC adjusted and flushed  PCR pending from foley  Assessment:  Active labor GBS negative Inadequate contractions Sporadic mild BP elevations  Plan: Start pitocin per low-dose protocol for support of contraction quality. Await PCR.  Nigel BridgemanLATHAM, Ammaar Encina CNM 06/06/2015, 6:02 PM

## 2015-06-07 ENCOUNTER — Encounter (HOSPITAL_COMMUNITY): Payer: Self-pay | Admitting: *Deleted

## 2015-06-07 LAB — CBC
HCT: 29.7 % — ABNORMAL LOW (ref 36.0–46.0)
HCT: 26.8 % — ABNORMAL LOW (ref 36.0–46.0)
HEMATOCRIT: 33 % — AB (ref 36.0–46.0)
HEMOGLOBIN: 10.4 g/dL — AB (ref 12.0–15.0)
HEMOGLOBIN: 8.6 g/dL — AB (ref 12.0–15.0)
Hemoglobin: 9.5 g/dL — ABNORMAL LOW (ref 12.0–15.0)
MCH: 21.8 pg — AB (ref 26.0–34.0)
MCH: 22 pg — AB (ref 26.0–34.0)
MCH: 22.1 pg — ABNORMAL LOW (ref 26.0–34.0)
MCHC: 31.5 g/dL (ref 30.0–36.0)
MCHC: 32 g/dL (ref 30.0–36.0)
MCHC: 32.1 g/dL (ref 30.0–36.0)
MCV: 69 fL — AB (ref 78.0–100.0)
MCV: 68.8 fL — ABNORMAL LOW (ref 78.0–100.0)
MCV: 68.9 fL — ABNORMAL LOW (ref 78.0–100.0)
PLATELETS: 151 10*3/uL (ref 150–400)
Platelets: 140 10*3/uL — ABNORMAL LOW (ref 150–400)
Platelets: 149 10*3/uL — ABNORMAL LOW (ref 150–400)
RBC: 4.78 MIL/uL (ref 3.87–5.11)
RBC: 4.32 MIL/uL (ref 3.87–5.11)
RBC: 3.89 MIL/uL (ref 3.87–5.11)
RDW: 15 % (ref 11.5–15.5)
RDW: 15 % (ref 11.5–15.5)
RDW: 15 % (ref 11.5–15.5)
WBC: 16.7 10*3/uL — AB (ref 4.0–10.5)
WBC: 16.2 10*3/uL — ABNORMAL HIGH (ref 4.0–10.5)
WBC: 14.8 10*3/uL — AB (ref 4.0–10.5)

## 2015-06-07 LAB — GLUCOSE, CAPILLARY
GLUCOSE-CAPILLARY: 105 mg/dL — AB (ref 65–99)
GLUCOSE-CAPILLARY: 122 mg/dL — AB (ref 65–99)

## 2015-06-07 LAB — COMPREHENSIVE METABOLIC PANEL
ALBUMIN: 2.4 g/dL — AB (ref 3.5–5.0)
ALK PHOS: 171 U/L — AB (ref 38–126)
ALT: 17 U/L (ref 14–54)
ANION GAP: 7 (ref 5–15)
AST: 31 U/L (ref 15–41)
BUN: 13 mg/dL (ref 6–20)
CO2: 27 mmol/L (ref 22–32)
Calcium: 8.2 mg/dL — ABNORMAL LOW (ref 8.9–10.3)
Chloride: 97 mmol/L — ABNORMAL LOW (ref 101–111)
Creatinine, Ser: 1.07 mg/dL — ABNORMAL HIGH (ref 0.44–1.00)
GFR calc Af Amer: >60 mL/min (ref >60)
GFR calc non Af Amer: >60 mL/min (ref >60)
GLUCOSE: 108 mg/dL — AB (ref 65–99)
POTASSIUM: 3.6 mmol/L (ref 3.5–5.1)
SODIUM: 131 mmol/L — AB (ref 135–145)
Total Bilirubin: 0.2 mg/dL — ABNORMAL LOW (ref 0.3–1.2)
Total Protein: 5.6 g/dL — ABNORMAL LOW (ref 6.5–8.1)

## 2015-06-07 LAB — LACTATE DEHYDROGENASE: LDH: 196 U/L — ABNORMAL HIGH (ref 98–192)

## 2015-06-07 LAB — HIV ANTIBODY (ROUTINE TESTING W REFLEX): HIV SCREEN 4TH GENERATION: NONREACTIVE

## 2015-06-07 LAB — URIC ACID: Uric Acid, Serum: 7.7 mg/dL — ABNORMAL HIGH (ref 2.3–6.6)

## 2015-06-07 LAB — MAGNESIUM
MAGNESIUM: 5.9 mg/dL — AB (ref 1.7–2.4)
MAGNESIUM: 6 mg/dL — AB (ref 1.7–2.4)

## 2015-06-07 LAB — RPR: RPR: NONREACTIVE

## 2015-06-07 MED ORDER — FENTANYL CITRATE (PF) 100 MCG/2ML IJ SOLN
INTRAMUSCULAR | Status: AC
  Filled 2015-06-07: qty 4

## 2015-06-07 MED ORDER — PRENATAL MULTIVITAMIN CH
1.0000 | ORAL_TABLET | Freq: Every day | ORAL | Status: DC
  Administered 2015-06-07 – 2015-06-08 (×2): 1 via ORAL
  Filled 2015-06-07 (×2): qty 1

## 2015-06-07 MED ORDER — DIPHENHYDRAMINE HCL 25 MG PO CAPS: 25.0000 mg | ORAL_CAPSULE | Freq: Four times a day (QID) | ORAL | Status: DC | PRN

## 2015-06-07 MED ORDER — ONDANSETRON HCL 4 MG/2ML IJ SOLN: 4.0000 mg | INTRAMUSCULAR | Status: DC | PRN

## 2015-06-07 MED ORDER — BENZOCAINE-MENTHOL 20-0.5 % EX AERO
1.0000 "application " | INHALATION_SPRAY | CUTANEOUS | Status: DC | PRN
  Filled 2015-06-07: qty 56

## 2015-06-07 MED ORDER — OXYTOCIN 40 UNITS IN LACTATED RINGERS INFUSION - SIMPLE MED
INTRAVENOUS | Status: AC
  Filled 2015-06-07: qty 1000

## 2015-06-07 MED ORDER — ACETAMINOPHEN 325 MG PO TABS: 650.0000 mg | ORAL_TABLET | ORAL | Status: DC | PRN

## 2015-06-07 MED ORDER — CARBOPROST TROMETHAMINE 250 MCG/ML IM SOLN
INTRAMUSCULAR | Status: AC
  Filled 2015-06-07: qty 1

## 2015-06-07 MED ORDER — MISOPROSTOL 200 MCG PO TABS
ORAL_TABLET | ORAL | Status: DC
Start: 2015-06-07 — End: 2015-06-07
  Filled 2015-06-07: qty 5

## 2015-06-07 MED ORDER — ZOLPIDEM TARTRATE 5 MG PO TABS: 5.0000 mg | ORAL_TABLET | Freq: Every evening | ORAL | Status: DC | PRN

## 2015-06-07 MED ORDER — SENNOSIDES-DOCUSATE SODIUM 8.6-50 MG PO TABS
2.0000 | ORAL_TABLET | ORAL | Status: DC
  Administered 2015-06-08 – 2015-06-09 (×2): 2 via ORAL
  Filled 2015-06-07 (×2): qty 2

## 2015-06-07 MED ORDER — OXYCODONE-ACETAMINOPHEN 5-325 MG PO TABS
1.0000 | ORAL_TABLET | ORAL | Status: DC | PRN
  Administered 2015-06-08 – 2015-06-09 (×2): 1 via ORAL
  Filled 2015-06-07 (×2): qty 1

## 2015-06-07 MED ORDER — SIMETHICONE 80 MG PO CHEW: 80.0000 mg | CHEWABLE_TABLET | ORAL | Status: DC | PRN

## 2015-06-07 MED ORDER — IBUPROFEN 600 MG PO TABS
600.0000 mg | ORAL_TABLET | Freq: Four times a day (QID) | ORAL | Status: DC
  Administered 2015-06-07 – 2015-06-09 (×8): 600 mg via ORAL
  Filled 2015-06-07 (×8): qty 1

## 2015-06-07 MED ORDER — DIBUCAINE 1 % RE OINT: 1.0000 "application " | TOPICAL_OINTMENT | RECTAL | Status: DC | PRN

## 2015-06-07 MED ORDER — LANOLIN HYDROUS EX OINT: TOPICAL_OINTMENT | CUTANEOUS | Status: DC | PRN

## 2015-06-07 MED ORDER — ONDANSETRON HCL 4 MG PO TABS: 4.0000 mg | ORAL_TABLET | ORAL | Status: DC | PRN

## 2015-06-07 MED ORDER — MAGNESIUM SULFATE 50 % IJ SOLN
1.0000 g/h | INTRAVENOUS | Status: AC
  Administered 2015-06-07: 2 g/h via INTRAVENOUS
  Filled 2015-06-07 (×2): qty 80

## 2015-06-07 MED ORDER — LACTATED RINGERS IV SOLN
INTRAVENOUS | Status: DC
  Administered 2015-06-07 (×2): via INTRAVENOUS

## 2015-06-07 MED ORDER — INFLUENZA VAC SPLIT QUAD 0.5 ML IM SUSY
0.5000 mL | PREFILLED_SYRINGE | INTRAMUSCULAR | Status: AC
  Administered 2015-06-08: 0.5 mL via INTRAMUSCULAR

## 2015-06-07 MED ORDER — OXYCODONE-ACETAMINOPHEN 5-325 MG PO TABS: 2.0000 | ORAL_TABLET | ORAL | Status: DC | PRN

## 2015-06-07 MED ORDER — FERROUS SULFATE 325 (65 FE) MG PO TABS
325.0000 mg | ORAL_TABLET | Freq: Two times a day (BID) | ORAL | Status: DC
  Administered 2015-06-08 (×2): 325 mg via ORAL
  Filled 2015-06-07 (×3): qty 1

## 2015-06-07 MED ORDER — MAGNESIUM SULFATE 50 % IJ SOLN
4.0000 g/h | Freq: Once | INTRAMUSCULAR | Status: AC
  Administered 2015-06-07: 4 g/h via INTRAVENOUS
  Filled 2015-06-07: qty 80

## 2015-06-07 MED ORDER — TETANUS-DIPHTH-ACELL PERTUSSIS 5-2.5-18.5 LF-MCG/0.5 IM SUSP
0.5000 mL | Freq: Once | INTRAMUSCULAR | Status: DC
  Filled 2015-06-07: qty 0.5

## 2015-06-07 MED ORDER — WITCH HAZEL-GLYCERIN EX PADS: 1.0000 "application " | MEDICATED_PAD | CUTANEOUS | Status: DC | PRN

## 2015-06-07 MED ORDER — METHYLERGONOVINE MALEATE 0.2 MG/ML IJ SOLN
INTRAMUSCULAR | Status: AC
  Filled 2015-06-07: qty 3

## 2015-06-07 NOTE — Progress Notes (Signed)
Subjective: Postpartum Day 1: Vaginal delivery, 1st degree vaginal, superficial right periurethral laceration, and periclitoral laceration Patient resting in bed, reports no syncope or dizziness Feeding:  Breast/bottle Contraceptive plan:  undecided  Objective: Vital signs in last 24 hours: Temp:  [97.4 F (36.3 C)-99 F (37.2 C)] 97.6 F (36.4 C) (12/14 0941) Pulse Rate:  [66-112] 76 (12/14 1157) Resp:  [16-20] 16 (12/14 1156) BP: (98-156)/(41-100) 104/68 mmHg (12/14 1157) SpO2:  [97 %-99 %] 97 % (12/14 1156) Weight:  [74.118 kg (163 lb 6.4 oz)-77.111 kg (170 lb)] 74.118 kg (163 lb 6.4 oz) (12/14 1051)  Physical Exam:  General: alert and cooperative Lochia: appropriate Uterine Fundus: firm Perineum: healing well  DVT Evaluation: No evidence of DVT seen on physical exam. Negative Homan's sign.   CBC Latest Ref Rng 06/07/2015 06/07/2015 06/06/2015  WBC 4.0 - 10.5 K/uL 16.2(H) 16.7(H) 9.4  Hemoglobin 12.0 - 15.0 g/dL 1.6(X9.5(L) 10.4(L) 12.1  Hematocrit 36.0 - 46.0 % 29.7(L) 33.0(L) 38.4  Platelets 150 - 400 K/uL 151 140(L) 156    Results for orders placed or performed during the hospital encounter of 06/06/15 (from the past 24 hour(s))  Glucose, capillary     Status: None   Collection Time: 06/06/15  4:00 PM  Result Value Ref Range   Glucose-Capillary 90 65 - 99 mg/dL  Protein / creatinine ratio, urine     Status: Abnormal   Collection Time: 06/06/15  5:15 PM  Result Value Ref Range   Creatinine, Urine 80.00 mg/dL   Total Protein, Urine 67 mg/dL   Protein Creatinine Ratio 0.84 (H) 0.00 - 0.15 mg/mg[Cre]  Glucose, capillary     Status: None   Collection Time: 06/06/15  9:12 PM  Result Value Ref Range   Glucose-Capillary 82 65 - 99 mg/dL  CBC     Status: Abnormal   Collection Time: 06/07/15  1:50 AM  Result Value Ref Range   WBC 16.7 (H) 4.0 - 10.5 K/uL   RBC 4.78 3.87 - 5.11 MIL/uL   Hemoglobin 10.4 (L) 12.0 - 15.0 g/dL   HCT 09.633.0 (L) 04.536.0 - 40.946.0 %   MCV 69.0 (L) 78.0  - 100.0 fL   MCH 21.8 (L) 26.0 - 34.0 pg   MCHC 31.5 30.0 - 36.0 g/dL   RDW 81.115.0 91.411.5 - 78.215.5 %   Platelets 140 (L) 150 - 400 K/uL  CBC     Status: Abnormal   Collection Time: 06/07/15  6:20 AM  Result Value Ref Range   WBC 16.2 (H) 4.0 - 10.5 K/uL   RBC 4.32 3.87 - 5.11 MIL/uL   Hemoglobin 9.5 (L) 12.0 - 15.0 g/dL   HCT 95.629.7 (L) 21.336.0 - 08.646.0 %   MCV 68.8 (L) 78.0 - 100.0 fL   MCH 22.0 (L) 26.0 - 34.0 pg   MCHC 32.0 30.0 - 36.0 g/dL   RDW 57.815.0 46.911.5 - 62.915.5 %   Platelets 151 150 - 400 K/uL  Magnesium     Status: Abnormal   Collection Time: 06/07/15 12:45 PM  Result Value Ref Range   Magnesium 5.9 (H) 1.7 - 2.4 mg/dL    Assessment/Plan: Status post vaginal delivery day 1. Pre-Eclampsia -  Post partum Magnesium, began 0219 Stop 24 hr magnesium infusion at 0220 on 06/08/15 Mag level @1200  5.9, Watch Urine output  Repeat PIH labs at 1800 Repeat Magnesium level at 1800 GDM - continue POC CBG  Stable- Continue current care. Dr. Su Hiltoberts aware and agrees with plan  Beatrix FettersRachel StallCNM 06/07/2015, 12:03  PM

## 2015-06-07 NOTE — Lactation Note (Signed)
This note was copied from the chart of [place]. Lactation Consultation Note Asked by Nursery RN to go see mom and baby d/t difficulty latching baby. Gathered supplies of DEBP. Moms RN stated she was resting now and needed to sleep to please back another time. Left pump and kit at nurses station for Teresa Brown for when she woke up and to call for Teresa Brown.  Patient Name: Teresa Brown BFXOV'A Date: 06/07/2015     Maternal Data    Feeding Feeding Type: Breast Fed Length of feed: 10 min  LATCH Score/Interventions Latch: Repeated attempts needed to sustain latch, nipple held in mouth throughout feeding, stimulation needed to elicit sucking reflex. Intervention(s): Skin to skin Intervention(s): Adjust position;Assist with latch;Breast massage;Breast compression  Audible Swallowing: A few with stimulation Intervention(s): Skin to skin;Hand expression  Type of Nipple: Everted at rest and after stimulation  Comfort (Breast/Nipple): Soft / non-tender     Hold (Positioning): Assistance needed to correctly position infant at breast and maintain latch.  LATCH Score: 7  Lactation Tools Discussed/Used     Consult Status      Teresa Brown G 06/07/2015, 5:18 AM

## 2015-06-07 NOTE — Lactation Note (Signed)
This note was copied from the chart of Teresa Gertie BaronSonyta Dalporto. Lactation Consultation Note  Patient Name: Teresa Brown NWGNF'AToday's Date: 06/07/2015 Reason for consult: Follow-up assessment;Difficult latch Baby 18 hours old. Mom reports that she pumped but "did not get anything." Reviewed the need to hand express after pumping. Mom declined assistance with hand expression, and has room full of visitors that have just arrived. Mom states that she will remember to hand express after pumping. Mom states that baby seems satisfied after last feeding. Enc mom to call for assistance with latching, hand expressing, or spoon-feeding baby.  Maternal Data    Feeding Feeding Type: Breast Fed Length of feed: 20 min  LATCH Score/Interventions Latch: Repeated attempts needed to sustain latch, nipple held in mouth throughout feeding, stimulation needed to elicit sucking reflex. Intervention(s): Skin to skin Intervention(s): Adjust position  Audible Swallowing: A few with stimulation Intervention(s): Skin to skin Intervention(s): Skin to skin  Type of Nipple: Everted at rest and after stimulation  Comfort (Breast/Nipple): Soft / non-tender     Hold (Positioning): Full assist, staff holds infant at breast Intervention(s): Breastfeeding basics reviewed;Support Pillows;Position options;Skin to skin  LATCH Score: 6  Lactation Tools Discussed/Used Tools: Pump Breast pump type: Double-Electric Breast Pump   Consult Status Consult Status: Follow-up Date: 06/08/15 Follow-up type: In-patient    [name], [name] 06/07/2015, 4:05 PM

## 2015-06-07 NOTE — Lactation Note (Addendum)
This note was copied from the chart of Boy Gertie BaronSonyta Kuhrt. Lactation Consultation Note  Patient Name: Boy Gertie BaronSonyta Zion ZOXWR'UToday's Date: 06/07/2015 Reason for consult: Follow-up assessment;Difficult latch  Baby 16 hours old. Assisted mom to latch baby first in football position to left breast, and then in cross-cradle position. Baby latched and suckled off-and-on. Mom still has 2 IVs and BP cuff and O2 monitor, so mom struggles to get baby latched. Enc mom to continue to let baby nurse and stay nuzzled at breast for next 15 or so minutes, then pump and hand express and give baby the EBM by spoon. Mom states that she understands. Enc mom to call for assistance as needed.  Maternal Data    Feeding Feeding Type: Breast Fed Length of feed: 5 min  LATCH Score/Interventions Latch: Repeated attempts needed to sustain latch, nipple held in mouth throughout feeding, stimulation needed to elicit sucking reflex. Intervention(s): Skin to skin Intervention(s): Adjust position;Assist with latch;Breast compression  Audible Swallowing: A few with stimulation Intervention(s): Skin to skin  Type of Nipple: Everted at rest and after stimulation  Comfort (Breast/Nipple): Soft / non-tender     Hold (Positioning): Assistance needed to correctly position infant at breast and maintain latch. Intervention(s): Breastfeeding basics reviewed;Support Pillows;Position options  LATCH Score: 7  Lactation Tools Discussed/Used     Consult Status Consult Status: Follow-up Date: 06/08/15 Follow-up type: In-patient    [name], Romuald Mccaslin 06/07/2015, 1:59 PM

## 2015-06-07 NOTE — Lactation Note (Signed)
This note was copied from the chart of Teresa Gertie BaronSonyta Mettler. Lactation Consultation Note  Patient Name: Teresa Brown ZOXWR'UToday's Date: 06/07/2015 Reason for consult: Initial assessment Baby 12 hours old. Mom states that the baby nursed an hour earlier. Asked mom if she has used the DEBP, set up at bedside. Mom states that she has seen a little colostrum. Demonstrated hand expression with colostrum present, and mom able to hand express opposite breast with colostrum present also--drops given to baby. Demonstrated to mom how to hand express into spoon and spoon-feed baby. Asked mom if baby has had a bath, and mom stated that she herself was too tired for the baby to have a bath. Assisted mom to sit up in bed, but mom still lying more flat than upright. Assisted mom to attempt to latch baby to right breast, but baby not willing to latch. Mom states baby did latch earlier and she feels comfortable with positioning. Discussed importance of having baby STS and latching baby. Mom stated that she herself needs to order breakfast. Discussed mom's breastfeeding goals. Mom stated that she intended to "try breastfeeding" and if it does not work, she intends to give formula. Enc mom to let baby stay STS for 10-15 minutes and attempt latching again, then use DEBP and hand express. Enc mom to keep attempting to nurse and hand express with each feed and give baby EBM until baby at breast nursing well. Demonstrated how to use piston for double hand pump use at home. Assisted mom to use DEBP and enc mom to hand express afterwards, giving baby whatever she gets. Discussed that baby's EBM needs are increasing with each feed, and we need to stimulate her breasts with DEBP to get EBM flowing.  Plan is for mom to offer lots of STS, put baby to breast with cues and by 2-3 hours if baby not cueing. Enc mom to hand express and spoon-feed baby, and then use DEBP for 15 minutes followed by hand expression. Discussed assessment, interventions,  and plan with CN RN Elon JesterMichele.    Maternal Data Has patient been taught Hand Expression?: Yes Does the patient have breastfeeding experience prior to this delivery?: No  Feeding Feeding Type: Breast Fed Length of feed: 0 min  LATCH Score/Interventions Latch: Too sleepy or reluctant, no latch achieved, no sucking elicited. Intervention(s): Skin to skin;Teach feeding cues;Waking techniques Intervention(s): Adjust position;Assist with latch;Breast compression  Audible Swallowing: None Intervention(s): Skin to skin;Hand expression  Type of Nipple: Everted at rest and after stimulation  Comfort (Breast/Nipple): Soft / non-tender     Hold (Positioning): Assistance needed to correctly position infant at breast and maintain latch. Intervention(s): Breastfeeding basics reviewed;Support Pillows;Position options;Skin to skin  LATCH Score: 5  Lactation Tools Discussed/Used Tools: Pump Breast pump type: Double-Electric Breast Pump WIC Program: Yes Pump Review: Setup, frequency, and cleaning;Milk Storage Initiated by:: CN RN Date initiated:: 06/07/15   Consult Status Consult Status: Follow-up Date: 06/08/15 Follow-up type: In-patient    [name], Amariz Flamenco 06/07/2015, 9:49 AM

## 2015-06-07 NOTE — Anesthesia Postprocedure Evaluation (Signed)
Anesthesia Post Note  Patient: Teresa Brown  Procedure(s) Performed: * No procedures listed *  Patient location during evaluation: Mother Baby Anesthesia Type: Epidural Level of consciousness: awake, awake and alert and patient cooperative Pain management: pain level controlled Vital Signs Assessment: post-procedure vital signs reviewed and stable Respiratory status: spontaneous breathing and respiratory function stable Cardiovascular status: stable and blood pressure returned to baseline Postop Assessment: no backache, no headache, spinal receding, patient able to bend at knees and no signs of nausea or vomiting Anesthetic complications: no    Last Vitals:  Filed Vitals:   06/07/15 1630 06/07/15 1632  BP:  104/60  Pulse: 81 76  Temp:  36.4 C  Resp:  16    Last Pain:  Filed Vitals:   06/07/15 1634  PainSc: 1                  Rhylen Pulido

## 2015-06-07 NOTE — Progress Notes (Signed)
Addendum Called to the room for [facility].  Upon arrival the nursing staff had already expressed clots weighing more than 1500cc.  FF no active bleeding.  VVS.   Results for orders placed or performed during the hospital encounter of 06/06/15 (from the past 24 hour(s))  CBC     Status: Abnormal   Collection Time: 06/06/15 12:35 PM  Result Value Ref Range   WBC 9.4 4.0 - 10.5 K/uL   RBC 5.51 (H) 3.87 - 5.11 MIL/uL   Hemoglobin 12.1 12.0 - 15.0 g/dL   HCT 16.138.4 09.636.0 - 04.546.0 %   MCV 69.7 (L) 78.0 - 100.0 fL   MCH 22.0 (L) 26.0 - 34.0 pg   MCHC 31.5 30.0 - 36.0 g/dL   RDW 40.915.0 81.111.5 - 91.415.5 %   Platelets 156 150 - 400 K/uL  Type and screen     Status: None (Preliminary result)   Collection Time: 06/06/15 12:35 PM  Result Value Ref Range   ABO/RH(D) B POS    Antibody Screen NEG    Sample Expiration 06/09/2015    Unit Number [id number]    Blood Component Type RBC LR PHER2    Unit division 00    Status of Unit ALLOCATED    Transfusion Status OK TO TRANSFUSE    Crossmatch Result Compatible    Unit Number [id number]    Blood Component Type RBC LR PHER1    Unit division 00    Status of Unit ALLOCATED    Transfusion Status OK TO TRANSFUSE    Crossmatch Result Compatible   RPR     Status: None   Collection Time: 06/06/15 12:35 PM  Result Value Ref Range   RPR Ser Ql Non Reactive Non Reactive  ABO/Rh     Status: None   Collection Time: 06/06/15 12:35 PM  Result Value Ref Range   ABO/RH(D) B POS   Glucose, capillary     Status: None   Collection Time: 06/06/15  4:00 PM  Result Value Ref Range   Glucose-Capillary 90 65 - 99 mg/dL  Protein / creatinine ratio, urine     Status: Abnormal   Collection Time: 06/06/15  5:15 PM  Result Value Ref Range   Creatinine, Urine 80.00 mg/dL   Total Protein, Urine 67 mg/dL   Protein Creatinine Ratio 0.84 (H) 0.00 - 0.15 mg/mg[Cre]  Glucose, capillary     Status: None   Collection Time: 06/06/15  9:12 PM  Result Value Ref Range   Glucose-Capillary  82 65 - 99 mg/dL  CBC     Status: Abnormal   Collection Time: 06/07/15  1:50 AM  Result Value Ref Range   WBC 16.7 (H) 4.0 - 10.5 K/uL   RBC 4.78 3.87 - 5.11 MIL/uL   Hemoglobin 10.4 (L) 12.0 - 15.0 g/dL   HCT 32.433.0 (L) 40.136.0 - 02.746.0 %   MCV 69.0 (L) 78.0 - 100.0 fL   MCH 21.8 (L) 26.0 - 34.0 pg   MCHC 31.5 30.0 - 36.0 g/dL   RDW 25.315.0 66.411.5 - 40.315.5 %   Platelets 140 (L) 150 - 400 K/uL  CBC     Status: Abnormal   Collection Time: 06/07/15  6:20 AM  Result Value Ref Range   WBC 16.2 (H) 4.0 - 10.5 K/uL   RBC 4.32 3.87 - 5.11 MIL/uL   Hemoglobin 9.5 (L) 12.0 - 15.0 g/dL   HCT 47.429.7 (L) 25.936.0 - 56.346.0 %   MCV 68.8 (L) 78.0 - 100.0 fL   MCH  22.0 (L) 26.0 - 34.0 pg   MCHC 32.0 30.0 - 36.0 g/dL   RDW 16.1 09.6 - 04.5 %   Platelets 151 150 - 400 K/uL

## 2015-06-07 NOTE — Progress Notes (Signed)
Addendum Results for orders placed or performed during the hospital encounter of 06/06/15 (from the past 24 hour(s))  CBC     Status: Abnormal   Collection Time: 06/06/15 12:35 PM  Result Value Ref Range   WBC 9.4 4.0 - 10.5 K/uL   RBC 5.51 (H) 3.87 - 5.11 MIL/uL   Hemoglobin 12.1 12.0 - 15.0 g/dL   HCT 16.138.4 09.636.0 - 04.546.0 %   MCV 69.7 (L) 78.0 - 100.0 fL   MCH 22.0 (L) 26.0 - 34.0 pg   MCHC 31.5 30.0 - 36.0 g/dL   RDW 40.915.0 81.111.5 - 91.415.5 %   Platelets 156 150 - 400 K/uL  Type and screen     Status: None   Collection Time: 06/06/15 12:35 PM  Result Value Ref Range   ABO/RH(D) B POS    Antibody Screen NEG    Sample Expiration 06/09/2015   ABO/Rh     Status: None   Collection Time: 06/06/15 12:35 PM  Result Value Ref Range   ABO/RH(D) B POS   Glucose, capillary     Status: None   Collection Time: 06/06/15  4:00 PM  Result Value Ref Range   Glucose-Capillary 90 65 - 99 mg/dL  Protein / creatinine ratio, urine     Status: Abnormal   Collection Time: 06/06/15  5:15 PM  Result Value Ref Range   Creatinine, Urine 80.00 mg/dL   Total Protein, Urine 67 mg/dL   Protein Creatinine Ratio 0.84 (H) 0.00 - 0.15 mg/mg[Cre]  Glucose, capillary     Status: None   Collection Time: 06/06/15  9:12 PM  Result Value Ref Range   Glucose-Capillary 82 65 - 99 mg/dL   Dr Estanislado Pandyivard consulted, pt to be placed on Mag 4gm bolus and 2 gm continuous

## 2015-06-08 DIAGNOSIS — O1490 Unspecified pre-eclampsia, unspecified trimester: Secondary | ICD-10-CM | POA: Diagnosis not present

## 2015-06-08 LAB — GLUCOSE, CAPILLARY: GLUCOSE-CAPILLARY: 94 mg/dL (ref 65–99)

## 2015-06-08 LAB — CBC
HCT: 23.2 % — ABNORMAL LOW (ref 36.0–46.0)
Hemoglobin: 7.4 g/dL — ABNORMAL LOW (ref 12.0–15.0)
MCH: 22.1 pg — AB (ref 26.0–34.0)
MCHC: 31.9 g/dL (ref 30.0–36.0)
MCV: 69.3 fL — ABNORMAL LOW (ref 78.0–100.0)
Platelets: 128 10*3/uL — ABNORMAL LOW (ref 150–400)
RBC: 3.35 MIL/uL — ABNORMAL LOW (ref 3.87–5.11)
RDW: 15.3 % (ref 11.5–15.5)
WBC: 10 10*3/uL (ref 4.0–10.5)

## 2015-06-08 MED ORDER — MEASLES, MUMPS & RUBELLA VAC ~~LOC~~ INJ
0.5000 mL | INJECTION | Freq: Once | SUBCUTANEOUS | Status: DC
  Filled 2015-06-08: qty 0.5

## 2015-06-08 MED ORDER — SODIUM CHLORIDE 0.9 % IJ SOLN
3.0000 mL | Freq: Two times a day (BID) | INTRAMUSCULAR | Status: DC
  Administered 2015-06-08 – 2015-06-09 (×2): 3 mL via INTRAVENOUS

## 2015-06-08 NOTE — Progress Notes (Signed)
Post Partum Day 2.  Denies HA, blurred vision or RUQ pain Subjective: no complaints, up ad lib, tolerating PO and + flatus  Objective: Blood pressure 107/55, pulse 64, temperature 97.7 F (36.5 C), temperature source Oral, resp. rate 20, height 5\' 2"  (1.575 m), weight 165 lb 4.8 oz (74.98 kg), last menstrual period 09/03/2014, SpO2 97 %, unknown if currently breastfeeding.  Physical Exam:  General: alert and cooperative Lochia: appropriate Uterine Fundus: firm Incision: na DVT Evaluation: No evidence of DVT seen on physical exam. 2 plus DTR no clonus B   Recent Labs  06/07/15 1817 06/08/15 0605  HGB 8.6* 7.4*  HCT 26.8* 23.2*    Assessment/Plan: Social Work consult pt with blunt affect BP WNL Anemia check orhtostatic vital signs Transfer to PP   LOS: 2 days   Kati Riggenbach A 06/08/2015, 10:35 AM

## 2015-06-08 NOTE — Lactation Note (Signed)
This note was copied from the chart of Teresa Gertie BaronSonyta Paige. Lactation Consultation Note  Patient Name: Teresa Brown ZOXWR'UToday's Date: 06/08/2015  Baby 41 hours old. Mom states that she has decided to stop attempting to BF and will be giving baby formula now. Anticipatory guidance given, and mom aware of LC phone line assistance after D/C.   Maternal Data    Feeding    [facility]                      Lactation Tools Discussed/Used     Consult Status      [name], Teresa Brown 06/08/2015, 3:03 PM
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
## 2015-06-08 NOTE — Progress Notes (Signed)
CSW received consult for how to apply for baby's Medicaid.  CSW notes that [name]'s facesheet states that she has Medicaid and therefore, baby will be an "auto-newborn" [facility].  CSW called unit and spoke with B. Martin/NT to ask that she inform [name] of this.  CSW provided numbers for financial counselors should [name] have any further Medicaid questions.

## 2015-06-09 MED ORDER — FERROUS SULFATE 325 (65 FE) MG PO TABS: 325.0000 mg | ORAL_TABLET | Freq: Two times a day (BID) | ORAL | Status: DC

## 2015-06-09 MED ORDER — OXYCODONE-ACETAMINOPHEN 5-325 MG PO TABS: 1.0000 | ORAL_TABLET | ORAL | Status: DC | PRN

## 2015-06-09 MED ORDER — IBUPROFEN 600 MG PO TABS: 600.0000 mg | ORAL_TABLET | Freq: Four times a day (QID) | ORAL | Status: DC | PRN

## 2015-06-09 NOTE — Progress Notes (Signed)
Noticed when reviewing chart that pt did not have her MMR vaccine prior to leaving. Pts MAR was charted on as "hold" prior to her transfer to our dept. Pt has already been d/c so pts SO was contacted and told that based on Rubella level pt needs MMR which she can receive at the health dept or a primary physician. Dr. Mancel Bale who is on call was made aware. Marry Guan

## 2015-06-09 NOTE — Discharge Summary (Signed)
OB Discharge Summary     Patient Name: Teresa Brown DOB: 1995/08/27 MRN: [id number]  Date of admission: 06/06/2015 Delivering MD: Adelina Mings   Date of discharge: 06/09/2015  Admitting diagnosis: ROM Intrauterine pregnancy: [redacted]w[redacted]d     Secondary diagnosis:  Principal Problem:   Normal vaginal delivery Active Problems:   Normal labor   Late prenatal care   Gestational diabetes--diet controlled   Preeclampsia Anemia  Additional problems: PP anemia     Discharge diagnosis: Term Pregnancy Delivered, Preeclampsia (mild) and Anemia                                                                                                Post partum procedures:None  Augmentation: Pitocin  Complications: Hospital course:  Onset of Labor With Vaginal Delivery     19 y.o. yo G1P1001 at [redacted]w[redacted]d was admitted in Active Labor at 7 cm from previous SROM at 10 am on 06/06/2015. Patient had an uncomplicated labor course as follows:  Membrane Rupture Time/Date: 10:00 AM ,06/06/2015   Intrapartum Procedures: Episiotomy: None [1]                                         Lacerations:  Periurethral [8];Vaginal [6] --1st degree vaginal, superficial right          periurethral, and periclitoral Patient had a delivery of a Viable infant. 06/06/2015  Information for the patient's newborn:  Caragh, Gasper [[id number]]  Delivery Method: Vag-Spont     Patient received mag for 24 hours.  Dx with pre-eclampsia without severe features due to mild elevation of BP during labor and PCR of 0.84.  Mg started after delivery and continued for 24 hours.  Patient had episode of uterine atony approx 2 hours after delivery, with several large clots passed, with 1500 cc total loss. No medication required.  Hgb 10.4 pre-delivery, 9.5 on day 1, 8.6 later that day, and 7.4 on day 2.  Patient declined transfusion and was orthostatically stable.  She was maintained an additional day, and d/c'd home on day 3.  SW consult was obtained  due to patient's flat affect, but no issues were identified.  Patient is appropriate in her interactions with baby and staff.  She is ambulating, tolerating a regular diet, passing flatus, and urinating well. Patient is discharged home in stable condition on 06/09/15.  Physical exam  Filed Vitals:   06/08/15 1618 06/08/15 2106 06/09/15 0033 06/09/15 0350  BP: 121/81 101/53 114/72 97/60  Pulse: 81 93 97 107  Temp: 98.1 F (36.7 C) 98.1 F (36.7 C) 98 F (36.7 C) 98 F (36.7 C)  TempSrc: Oral Oral Oral Oral  Resp: Height:      Weight:      SpO2:       General: alert Lochia: appropriate Uterine Fundus: firm Incision: Healing well with no significant drainage DVT Evaluation: No evidence of DVT seen on physical exam. Negative Homan's sign. Labs: CBC Latest Ref Rng 06/08/2015 06/07/2015  06/07/2015  WBC 4.0 - 10.5 K/uL 10.0 14.8(H) 16.2(H)  Hemoglobin 12.0 - 15.0 g/dL 7.4(L) 8.6(L) 9.5(L)  Hematocrit 36.0 - 46.0 % 23.2(L) 26.8(L) 29.7(L)  Platelets 150 - 400 K/uL 128(L) 149(L) 151    CMP Latest Ref Rng 06/07/2015  Glucose 65 - 99 mg/dL 161(W108(H)  BUN 6 - 20 mg/dL 13  Creatinine 9.600.44 - 4.541.00 mg/dL 0.98(J1.07(H)  Sodium 191135 - 478145 mmol/L 131(L)  Potassium 3.5 - 5.1 mmol/L 3.6  Chloride 101 - 111 mmol/L 97(L)  CO2 22 - 32 mmol/L 27  Calcium 8.9 - 10.3 mg/dL 8.2(L)  Total Protein 6.5 - 8.1 g/dL 2.9(F5.6(L)  Total Bilirubin 0.3 - 1.2 mg/dL 6.2(Z0.2(L)  Alkaline Phos 38 - 126 U/L 171(H)  AST 15 - 41 U/L 31  ALT 14 - 54 U/L 17    Discharge instruction: per After Visit Summary and "Baby and Me Booklet".  After visit meds:    Medication List    TAKE these medications        ferrous sulfate 325 (65 FE) MG tablet  Take 1 tablet (325 mg total) by mouth 2 (two) times daily with a meal.     ibuprofen 600 MG tablet  Commonly known as:  ADVIL,MOTRIN  Take 1 tablet (600 mg total) by mouth every 6 (six) hours as needed.     oxyCODONE-acetaminophen 5-325 MG tablet  Commonly known as:   PERCOCET/ROXICET  Take 1 tablet by mouth every 4 (four) hours as needed (for pain scale 4-7).        Diet: routine diet  Activity: Advance as tolerated. Pelvic rest for 6 weeks.   Outpatient follow up:5 weeks. Follow up Appt:  None Follow up Visit: Within 1 week with [facility] RN for BP check.  Postpartum contraception: IUD Mirena  Newborn Data: Live born female  Birth Weight: 7 lb 6 oz (3345 g) APGAR: 8, 9  Baby Feeding: Bottle and Breast Disposition:home with mother   06/09/2015 Nigel BridgemanLATHAM, Zyrus Hetland, CNM

## 2015-06-09 NOTE — Progress Notes (Signed)
[facility] was called and left message requesting RN for BP check within 1 wk. Pt was d/c and told her if she did not hear back from them within the week, she should contact [facility]. I received phone call at this time from Debera LatNicky Finch with [facility] who said that there is no reason that patient shouldn't be seen next week. [facility] will follow up. Sheryn BisonGordon, Kalena Mander Warner

## 2015-06-09 NOTE — Discharge Instructions (Signed)

## 2015-06-09 NOTE — Progress Notes (Signed)
Pt discharged to home.  Discharge instructions reviewed,all questions answered.  Reviewed reasons to seek medical care for both mom and baby.  Prescriptions reviewed and given to pt to have filled.  Pt belongings all at the bedside and taken to main entrance with family, escorted by NT.  IV discontinued prior to discharge.  Copy of the discharge instructions and baby and me booklet sent home with the pt.  Spouse brought car around and pt walked out to car.

## 2015-06-09 NOTE — Plan of Care (Signed)
Problem: Role Relationship: Goal: Ability to demonstrate positive interaction with newborn will improve Outcome: Completed/Met Date Met:  06/09/15 Appears to be bonding well with newborn

## 2015-06-10 LAB — TYPE AND SCREEN
ABO/RH(D): B POS
ANTIBODY SCREEN: NEGATIVE
UNIT DIVISION: 0
Unit division: 0
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
## 2019-05-12 ENCOUNTER — Other Ambulatory Visit: Payer: Self-pay

## 2019-05-12 DIAGNOSIS — Z20822 Contact with and (suspected) exposure to covid-19: Secondary | ICD-10-CM

## 2019-05-14 LAB — NOVEL CORONAVIRUS, NAA: SARS-CoV-2, NAA: NOT DETECTED

## 2020-07-24 DIAGNOSIS — Z0000 Encounter for general adult medical examination without abnormal findings: Secondary | ICD-10-CM | POA: Diagnosis not present

## 2020-07-24 DIAGNOSIS — M5441 Lumbago with sciatica, right side: Secondary | ICD-10-CM | POA: Diagnosis not present

## 2020-07-24 DIAGNOSIS — Z13220 Encounter for screening for lipoid disorders: Secondary | ICD-10-CM | POA: Diagnosis not present

## 2020-07-24 DIAGNOSIS — E669 Obesity, unspecified: Secondary | ICD-10-CM | POA: Diagnosis not present

## 2020-07-24 DIAGNOSIS — M5442 Lumbago with sciatica, left side: Secondary | ICD-10-CM | POA: Diagnosis not present

## 2020-07-24 DIAGNOSIS — G8929 Other chronic pain: Secondary | ICD-10-CM | POA: Diagnosis not present

## 2020-07-24 DIAGNOSIS — Z113 Encounter for screening for infections with a predominantly sexual mode of transmission: Secondary | ICD-10-CM | POA: Diagnosis not present

## 2020-07-24 DIAGNOSIS — Z23 Encounter for immunization: Secondary | ICD-10-CM | POA: Diagnosis not present

## 2020-08-01 DIAGNOSIS — R718 Other abnormality of red blood cells: Secondary | ICD-10-CM | POA: Diagnosis not present

## 2021-04-25 ENCOUNTER — Ambulatory Visit (HOSPITAL_COMMUNITY)
Admission: EM | Admit: 2021-04-25 | Discharge: 2021-04-25 | Disposition: A | Discharge status: Home / Self Care | Payer: Self-pay | Attending: Medical Oncology | Admitting: Medical Oncology

## 2021-04-25 ENCOUNTER — Encounter (HOSPITAL_COMMUNITY): Payer: Self-pay | Admitting: Emergency Medicine

## 2021-04-25 DIAGNOSIS — J069 Acute upper respiratory infection, unspecified: Secondary | ICD-10-CM

## 2021-04-25 DIAGNOSIS — J4521 Mild intermittent asthma with (acute) exacerbation: Secondary | ICD-10-CM

## 2021-04-25 MED ORDER — BENZONATATE 100 MG PO CAPS: 100.0000 mg | ORAL_CAPSULE | Freq: Three times a day (TID) | ORAL | 0 refills | Status: DC

## 2021-04-25 MED ORDER — FLUTICASONE PROPIONATE 50 MCG/ACT NA SUSP: 2.0000 | Freq: Every day | NASAL | 0 refills | Status: DC

## 2021-04-25 MED ORDER — ALBUTEROL SULFATE HFA 108 (90 BASE) MCG/ACT IN AERS: 1.0000 | INHALATION_SPRAY | Freq: Four times a day (QID) | RESPIRATORY_TRACT | 0 refills | Status: DC | PRN

## 2021-04-25 MED ORDER — PREDNISONE 10 MG (21) PO TBPK: ORAL_TABLET | Freq: Every day | ORAL | 0 refills | Status: DC

## 2021-04-25 NOTE — ED Provider Notes (Signed)
[facility]    CSN: [id number] Arrival date & time: 04/25/21  1714      History   Chief Complaint Chief Complaint  Patient presents with   Cough   Shortness of Breath   Sore Throat    HPI Teresa Brown is a 25 y.o. female.   HPI  Cough: Patient with a history of asthma presents with symptoms of a dry cough, shortness of breath and sore throat along with body aches and fever for the past week.  She has been using cough drops, OTC medications for symptoms. No known sick contacts. She denies chest pain, hemoptysis, vomiting.   Past Medical History:  Diagnosis Date   Asthma    Gestational diabetes     Patient Active Problem List   Diagnosis Date Noted   Preeclampsia 06/08/2015   Normal vaginal delivery 06/07/2015   Normal labor 06/06/2015   Late prenatal care 06/06/2015   Gestational diabetes--diet controlled 06/06/2015    Past Surgical History:  Procedure Laterality Date   NO PAST SURGERIES      OB History     Gravida  1   Para  1   Term  1   Preterm      AB      Living  1      SAB      IAB      Ectopic      Multiple  0   Live Births  1            Home Medications    Prior to Admission medications   Medication Sig Start Date End Date Taking? Authorizing Provider  ferrous sulfate 325 (65 FE) MG tablet Take 1 tablet (325 mg total) by mouth 2 (two) times daily with a meal. 06/09/15   Donnel Saxon, CNM  ibuprofen (ADVIL,MOTRIN) 600 MG tablet Take 1 tablet (600 mg total) by mouth every 6 (six) hours as needed. 06/09/15   Donnel Saxon, CNM  oxyCODONE-acetaminophen (PERCOCET/ROXICET) 5-325 MG tablet Take 1 tablet by mouth every 4 (four) hours as needed (for pain scale 4-7). 06/09/15   Donnel Saxon, CNM    Family History History reviewed. No pertinent family history.  Social History Social History   Tobacco Use   Smoking status: Never   Smokeless tobacco: Never  Substance Use Topics   Alcohol use: No   Drug use: No      Allergies   Patient has no known allergies.   Review of Systems Review of Systems  As stated above in HPI Physical Exam Triage Vital Signs ED Triage Vitals  Enc Vitals Group     BP 04/25/21 1829 (!) 118/59     Pulse Rate 04/25/21 1829 (!) 58     Resp 04/25/21 1829 16     Temp 04/25/21 1829 99.2 F (37.3 C)     Temp Source 04/25/21 1829 Oral     SpO2 04/25/21 1829 100 %     Weight --      Height --      Head Circumference --      Peak Flow --      Pain Score 04/25/21 1826 8     Pain Loc --      Pain Edu? --      Excl. in [place]? --    No data found.  Updated Vital Signs BP (!) 118/59 (BP Location: Right Arm)   Pulse (!) 58   Temp 99.2 F (37.3 C) (Oral)  Resp 16   SpO2 100%   Physical Exam Vitals and nursing note reviewed.  Constitutional:      General: She is not in acute distress.    Appearance: She is well-developed. She is not ill-appearing, toxic-appearing or diaphoretic.  HENT:     Head: Normocephalic and atraumatic.     Right Ear: Tympanic membrane normal.     Left Ear: Tympanic membrane normal.     Ears:     Comments: Middle ear effusion bilaterally     Mouth/Throat:     Mouth: Mucous membranes are moist.     Pharynx: Oropharynx is clear. No pharyngeal swelling or oropharyngeal exudate.  Eyes:     Extraocular Movements: Extraocular movements intact.     Pupils: Pupils are equal, round, and reactive to light.  Cardiovascular:     Rate and Rhythm: Normal rate and regular rhythm.     Heart sounds: Normal heart sounds.  Pulmonary:     Effort: Pulmonary effort is normal.     Breath sounds: Wheezing (scant scattered) present.  Musculoskeletal:     Cervical back: Normal range of motion and neck supple.  Lymphadenopathy:     Cervical: No cervical adenopathy.  Skin:    General: Skin is warm.     Coloration: Skin is not cyanotic.  Neurological:     Mental Status: She is alert and oriented to person, place, and time.     UC Treatments /  Results  Labs (all labs ordered are listed, but only abnormal results are displayed) Labs Reviewed - No data to display  EKG   Radiology No results found.  Procedures Procedures (including critical care time)  Medications Ordered in UC Medications - No data to display  Initial Impression / Assessment and Plan / UC Course  I have reviewed the triage vital signs and the nursing notes.  Pertinent labs & imaging results that were available during my care of the patient were reviewed by me and considered in my medical decision making (see chart for details).     New.  Likely viral causing asthma exacerbation.  Treating appropriately to prevent further complication.  Discussed red flag signs and symptom with patient.  Rest and hydration encouraged. Final Clinical Impressions(s) / UC Diagnoses   Final diagnoses:  None   Discharge Instructions   None    ED Prescriptions   None    PDMP not reviewed this encounter.   Rushie Chestnut, [place] 04/25/21 1912

## 2021-04-25 NOTE — ED Triage Notes (Signed)
Pt presents with dry cough, sob, and sore throat Xs 1 week.

## 2021-08-24 ENCOUNTER — Encounter (HOSPITAL_COMMUNITY): Payer: Self-pay

## 2021-08-24 ENCOUNTER — Ambulatory Visit (HOSPITAL_COMMUNITY)
Admission: EM | Admit: 2021-08-24 | Discharge: 2021-08-24 | Disposition: A | Discharge status: Home / Self Care | Payer: Managed Care, Other (non HMO) | Attending: Family Medicine | Admitting: Family Medicine

## 2021-08-24 ENCOUNTER — Other Ambulatory Visit: Payer: Self-pay

## 2021-08-24 DIAGNOSIS — J069 Acute upper respiratory infection, unspecified: Secondary | ICD-10-CM | POA: Insufficient documentation

## 2021-08-24 DIAGNOSIS — U071 COVID-19: Secondary | ICD-10-CM | POA: Diagnosis not present

## 2021-08-24 DIAGNOSIS — R059 Cough, unspecified: Secondary | ICD-10-CM | POA: Insufficient documentation

## 2021-08-24 LAB — POC INFLUENZA A AND B ANTIGEN (URGENT CARE ONLY)
INFLUENZA A ANTIGEN, POC: NEGATIVE
INFLUENZA B ANTIGEN, POC: NEGATIVE

## 2021-08-24 MED ORDER — BENZONATATE 100 MG PO CAPS: 100.0000 mg | ORAL_CAPSULE | Freq: Three times a day (TID) | ORAL | 0 refills | Status: DC | PRN

## 2021-08-24 NOTE — ED Provider Notes (Addendum)
?[facility] ? ? ? ?CSN: [id number] ?Arrival date & time: 08/24/21  1635 ? ? ?  ? ?History   ?Chief Complaint ?Chief Complaint  ?Patient presents with  ? Fever  ? ? ?HPI ?Teresa Brown is a 26 y.o. female.  ? ? ?Fever ?For fever, nasal congestion, and cough since yesterday.  No vomiting but she has had some diarrhea.  Temp has been as high as 101.8.  She has maybe felt short of breath a little bit off and on.  She has a history of asthma but has not felt herself to be wheezing since this began ? ?She has had a little blood in the mucus she is coughing up and blowing out ? ?Past Medical History:  ?Diagnosis Date  ? Asthma   ? Gestational diabetes   ? ? ?Patient Active Problem List  ? Diagnosis Date Noted  ? Preeclampsia 06/08/2015  ? Normal vaginal delivery 06/07/2015  ? Normal labor 06/06/2015  ? Late prenatal care 06/06/2015  ? Gestational diabetes--diet controlled 06/06/2015  ? ? ?Past Surgical History:  ?Procedure Laterality Date  ? NO PAST SURGERIES    ? ? ?OB History   ? ? Gravida  ?1  ? Para  ?1  ? Term  ?1  ? Preterm  ?   ? AB  ?   ? Living  ?1  ?  ? ? SAB  ?   ? IAB  ?   ? Ectopic  ?   ? Multiple  ?0  ? Live Births  ?1  ?   ?  ?  ? ? ? ?Home Medications   ? ?Prior to Admission medications   ?Medication Sig Start Date End Date Taking? Authorizing Provider  ?benzonatate (TESSALON) 100 MG capsule Take 1 capsule (100 mg total) by mouth 3 (three) times daily as needed for cough. 08/24/21  Yes Zenia Resides, MD  ?albuterol (VENTOLIN HFA) 108 (90 Base) MCG/ACT inhaler Inhale 1-2 puffs into the lungs every 6 (six) hours as needed for wheezing or shortness of breath. 04/25/21   Rushie Chestnut, PA-C  ?ferrous sulfate 325 (65 FE) MG tablet Take 1 tablet (325 mg total) by mouth 2 (two) times daily with a meal. 06/09/15   Nigel Bridgeman, CNM  ?fluticasone (FLONASE) 50 MCG/ACT nasal spray Place 2 sprays into both nostrils daily. 04/25/21   Rushie Chestnut, PA-C  ? ? ?Family History ?History reviewed. No  pertinent family history. ? ?Social History ?Social History  ? ?Tobacco Use  ? Smoking status: Never  ? Smokeless tobacco: Never  ?Substance Use Topics  ? Alcohol use: No  ? Drug use: No  ? ? ? ?Allergies   ?Patient has no known allergies. ? ? ?Review of Systems ?Review of Systems  ?Constitutional:  Positive for fever.  ? ? ?Physical Exam ?Triage Vital Signs ?ED Triage Vitals  ?Enc Vitals Group  ?   BP 08/24/21 1724 104/71  ?   Pulse Rate 08/24/21 1724 92  ?   Resp 08/24/21 1724 16  ?   Temp 08/24/21 1724 (!) 100.7 ?F (38.2 ?C)  ?   Temp Source 08/24/21 1724 Oral  ?   SpO2 08/24/21 1724 100 %  ?   Weight --   ?   Height --   ?   Head Circumference --   ?   Peak Flow --   ?   Pain Score 08/24/21 1725 3  ?   Pain Loc --   ?  Pain Edu? --   ?   Excl. in [place]? --   ? ?No data found. ? ?Updated Vital Signs ?BP 104/71 (BP Location: Left Arm)   Pulse 92   Temp (!) 100.7 ?F (38.2 ?C) (Oral)   Resp 16   SpO2 100%  ? ?Visual Acuity ?Right Eye Distance:   ?Left Eye Distance:   ?Bilateral Distance:   ? ?Right Eye Near:   ?Left Eye Near:    ?Bilateral Near:    ? ?Physical Exam ?Vitals reviewed.  ?Constitutional:   ?   General: She is not in acute distress. ?   Appearance: She is not toxic-appearing.  ?HENT:  ?   Right Ear: Tympanic membrane and ear canal normal.  ?   Left Ear: Tympanic membrane and ear canal normal.  ?   Nose: Nose normal.  ?   Mouth/Throat:  ?   Mouth: Mucous membranes are moist.  ?   Pharynx: No oropharyngeal exudate or posterior oropharyngeal erythema.  ?Eyes:  ?   Extraocular Movements: Extraocular movements intact.  ?   Conjunctiva/sclera: Conjunctivae normal.  ?   Pupils: Pupils are equal, round, and reactive to light.  ?Cardiovascular:  ?   Rate and Rhythm: Normal rate and regular rhythm.  ?   Heart sounds: No murmur heard. ?Pulmonary:  ?   Effort: Pulmonary effort is normal. No respiratory distress.  ?   Breath sounds: No wheezing, rhonchi or rales.  ?Musculoskeletal:  ?   Cervical back: Neck supple.   ?Lymphadenopathy:  ?   Cervical: No cervical adenopathy.  ?Skin: ?   Capillary Refill: Capillary refill takes less than 2 seconds.  ?   Coloration: Skin is not jaundiced or pale.  ?Neurological:  ?   General: No focal deficit present.  ?   Mental Status: She is alert and oriented to person, place, and time.  ?Psychiatric:     ?   Behavior: Behavior normal.  ? ? ? ?UC Treatments / Results  ?Labs ?(all labs ordered are listed, but only abnormal results are displayed) ?Labs Reviewed  ?SARS CORONAVIRUS 2 (TAT 6-24 HRS)  ?POC INFLUENZA A AND B ANTIGEN (URGENT CARE ONLY)  ? ? ?EKG ? ? ?Radiology ?No results found. ? ?Procedures ?Procedures (including critical care time) ? ?Medications Ordered in UC ?Medications - No data to display ? ?Initial Impression / Assessment and Plan / UC Course  ?I have reviewed the triage vital signs and the nursing notes. ? ?Pertinent labs & imaging results that were available during my care of the patient were reviewed by me and considered in my medical decision making (see chart for details). ? ?  ? ?Flu test is negative.  We will test for COVID so she knows if she needs to quarantine.  She does have asthma, and her BMI is 30, so she is at risk for severe COVID.  Her GFR was normal when last done but has been some years.  If she is positive for COVID she should have molnupiravir prescription done ?Final Clinical Impressions(s) / UC Diagnoses  ? ?Final diagnoses:  ?Viral URI with cough  ? ? ? ?Discharge Instructions   ? ?  ?Flu test is negative ? ? ?You have been swabbed for COVID, and the test will result in the next 24 hours. Our staff will call you if positive. If the test is positive, you should quarantine for 5 days.  ? ? ? ? ?ED Prescriptions   ? ? Medication Sig Dispense Auth.  Provider  ? benzonatate (TESSALON) 100 MG capsule Take 1 capsule (100 mg total) by mouth 3 (three) times daily as needed for cough. 21 capsule Zenia Resides, MD  ? ?  ? ?PDMP not reviewed this encounter. ?   ?Zenia Resides, MD ?08/24/21 1824 ? ?  ?Zenia Resides, MD ?08/24/21 1825 ? ?  ?Zenia Resides, MD ?08/24/21 1829 ? ?

## 2021-08-24 NOTE — Discharge Instructions (Addendum)
Flu test is negative ? ? ?You have been swabbed for COVID, and the test will result in the next 24 hours. Our staff will call you if positive. If the test is positive, you should quarantine for 5 days.  ?

## 2021-08-25 LAB — SARS CORONAVIRUS 2 (TAT 6-24 HRS): SARS Coronavirus 2: POSITIVE — AB

## 2022-06-25 ENCOUNTER — Ambulatory Visit (INDEPENDENT_AMBULATORY_CARE_PROVIDER_SITE_OTHER): Payer: Managed Care, Other (non HMO) | Admitting: Internal Medicine

## 2022-06-25 ENCOUNTER — Encounter: Payer: Self-pay | Admitting: Internal Medicine

## 2022-06-25 ENCOUNTER — Other Ambulatory Visit: Payer: Self-pay

## 2022-06-25 VITALS — BP 110/60 | HR 86 | Temp 98.1°F | Resp 20 | Ht 60.43 in | Wt 179.7 lb

## 2022-06-25 DIAGNOSIS — J302 Other seasonal allergic rhinitis: Secondary | ICD-10-CM

## 2022-06-25 DIAGNOSIS — L501 Idiopathic urticaria: Secondary | ICD-10-CM | POA: Diagnosis not present

## 2022-06-25 DIAGNOSIS — J4520 Mild intermittent asthma, uncomplicated: Secondary | ICD-10-CM | POA: Diagnosis not present

## 2022-06-25 DIAGNOSIS — J3089 Other allergic rhinitis: Secondary | ICD-10-CM | POA: Diagnosis not present

## 2022-06-25 DIAGNOSIS — H1013 Acute atopic conjunctivitis, bilateral: Secondary | ICD-10-CM

## 2022-06-25 MED ORDER — FEXOFENADINE HCL 180 MG PO TABS: 180.0000 mg | ORAL_TABLET | Freq: Two times a day (BID) | ORAL | 5 refills | Status: DC | PRN

## 2022-06-25 MED ORDER — AZELASTINE HCL 0.1 % NA SOLN: 1.0000 | Freq: Two times a day (BID) | NASAL | 5 refills | Status: DC

## 2022-06-25 MED ORDER — ALBUTEROL SULFATE HFA 108 (90 BASE) MCG/ACT IN AERS: 1.0000 | INHALATION_SPRAY | Freq: Four times a day (QID) | RESPIRATORY_TRACT | 0 refills | Status: DC | PRN

## 2022-06-25 MED ORDER — FAMOTIDINE 20 MG PO TABS: 20.0000 mg | ORAL_TABLET | Freq: Two times a day (BID) | ORAL | 5 refills | Status: DC | PRN

## 2022-06-25 MED ORDER — OLOPATADINE HCL 0.2 % OP SOLN: 1.0000 [drp] | Freq: Every day | OPHTHALMIC | 5 refills | Status: DC | PRN

## 2022-06-25 NOTE — Patient Instructions (Addendum)
Chronic Idiopathic Urticaria (Hives)/Dermatographism:  - At this time etiology of hives and swelling is unknown. Hives can be caused by a variety of different triggers including illness/infection, exercise, pressure, vibrations, extremes of temperature to name a few however majority of the time there is no identifiable trigger.  You also have dermatographism as a trigger. -If hives persist at next visit or if they don't respond to anti-histamines, we will obtain labs to rule out inflammatory/autoimmune/alpha gal/mast cell causes. -Start Allegra 180mg  daily.   -If no improvement in 7 days, increase to Allegra 180mg  twice daily.   -If no improvement in 7 days, add Pepcid 20mg  twice daily and continue Allegra 180mg  twice daily. -If still no improvement, increase to Allegra 360mg  twice daily and Pepcid 40mg  twice daily.  Allergic Rhinitis Allergic Conjunctivitis - Positive skin test 06/2022: grasses, trees, weeds, mold, dust mite  - Avoidance measures discussed. - Use nasal saline rinses before nose sprays such as with Neilmed Sinus Rinse.  Use distilled water.   - Use Azelastine 1-2 sprays each nostril twice daily as needed.  Aim upward and outward. - Use Allegra 180mg  daily as needed.  - For eyes, use Olopatadine or Ketotifen 1 eye drop daily as needed for itchy, watery eyes.  Available over the counter, if not covered by insurance.  - Consider allergy shots as long term control of your symptoms by teaching your immune system to be more tolerant of your allergy triggers   Mild Intermittent Asthma: - MDI technique discussed.  Normal spirometry today.  - Maintenance inhaler: none - Rescue inhaler: Albuterol 2 puffs via spacer or 1 vial via nebulizer every 4-6 hours as needed for respiratory symptoms of cough, shortness of breath, or wheezing Asthma control goals:  Full participation in all desired activities (may need albuterol before activity) Albuterol use two times or less a week on average  (not counting use with activity) Cough interfering with sleep two times or less a month Oral steroids no more than once a year No hospitalizations   ALLERGEN AVOIDANCE MEASURES   Dust Mites Use central air conditioning and heat; and change the filter monthly.  Pleated filters work better than mesh filters.  Electrostatic filters may also be used; wash the filter monthly.  Window air conditioners may be used, but do not clean the air as well as a central air conditioner.  Change or wash the filter monthly. Keep windows closed.  Do not use attic fans.   Encase the mattress, box springs and pillows with zippered, dust proof covers. Wash the bed linens in hot water weekly.   Remove carpet, especially from the bedroom. Remove stuffed animals, throw pillows, dust ruffles, heavy drapes and other items that collect dust from the bedroom. Do not use a humidifier.   Use wood, vinyl or leather furniture instead of cloth furniture in the bedroom. Keep the indoor humidity at 30 - 40%.  Monitor with a humidity gauge.  Molds - Indoor avoidance Use air conditioning to reduce indoor humidity.  Do not use a humidifier. Keep indoor humidity at 30 - 40%.  Use a dehumidifier if needed. In the bathroom use an exhaust fan or open a window after showering.  Wipe down damp surfaces after showering.  Clean bathrooms with a mold-killing solution (diluted bleach, or products like Tilex, etc) at least once a month. In the kitchen use an exhaust fan to remove steam from cooking.  Throw away spoiled foods immediately, and empty garbage daily.  Empty water pans below  self-defrosting refrigerators frequently. Vent the clothes dryer to the outside. Limit indoor houseplants; mold grows in the dirt.  No houseplants in the bedroom. Remove carpet from the bedroom. Encase the mattress and box springs with a zippered encasing.  Molds - Outdoor avoidance Avoid being outside when the grass is being mowed, or the ground is  tilled. Avoid playing in leaves, pine straw, hay, etc.  Dead plant materials contain mold. Avoid going into barns or grain storage areas. Remove leaves, clippings and compost from around the home.  Pollen Avoidance Pollen levels are highest during the mid-day and afternoon.  Consider this when planning outdoor activities. Avoid being outside when the grass is being mowed, or wear a mask if the pollen-allergic person must be the one to mow the grass. Keep the windows closed to keep pollen outside of the home. Use an air conditioner to filter the air. Take a shower, wash hair, and change clothing after working or playing outdoors during pollen season.

## 2022-06-25 NOTE — Progress Notes (Signed)
NEW PATIENT  Date of Service/Encounter:  06/25/22  Consult requested by: Donald Prose, MD   Subjective:   Teresa Brown (DOB: 16-Apr-1996) is a 27 y.o. female who presents to the clinic on 06/25/2022 with a chief complaint of Pruritus (Itching everywhere all the time. Cause unknown. Skin gets puffy after scratching. When the temperature is warmer/hotter the itching is worse.) and Establish Care .    History obtained from: chart review and patient.   Asthma:  Diagnosed in childhood.   0 daytime symptoms in past month, 0 nighttime awakenings in past month Using rescue inhaler: rarely requires it about once a year usually with illness  Limitations to daily activity: none 0 ED visits/UC visits and 1 oral steroids in the past year 0 number of lifetime hospitalizations, 0 number of lifetime intubations.  Identified Triggers: respiratory illness Prior PFTs or spirometry: none  Current regimen:  Rescue: Albuterol 2 puffs q4-6 hrs PRN  Rhinitis:  Started  Symptoms include: nasal congestion, rhinorrhea, post nasal drainage, sneezing, watery eyes, and itchy eyes  Occurs seasonally-Spring Potential triggers: pollen Treatments tried:  Claritin/Zyrtec/Flonase PRN; last use of anti histamines was months ago  Previous allergy testing: no History of reflux/heartburn:  only with spicy foods but not frequent symptoms History of sinus surgery: no Nonallergic triggers: none   Hives/Itching:  Onset about 2 months ago, wherever she scratches, she has puffiness and raised.  It is worse with warm weather.  She takes a cool shower and moisturizes her skin with Cetaphil which does help.  It does go away within a few hours but it is happening almost daily.  She also has hives that occur when she doesn't scratch her skin.  She did get a new cat.  Hives itch and are raised but not painful and do not scar.   She has had one episode of eye swelling.   No new medications.  She is on metformin for DM.   No  illness.   Past Medical History: Past Medical History:  Diagnosis Date   Asthma    Gestational diabetes     Past Surgical History: Past Surgical History:  Procedure Laterality Date   NO PAST SURGERIES      Family History: Family History  Problem Relation Age of Onset   Asthma Brother     Social History:  Lives in a unknown year house Flooring in bedroom: carpet Pets: cat Tobacco use/exposure: none Job: lab tech  Medication List:  Allergies as of 06/25/2022   No Known Allergies      Medication List        Accurate as of June 25, 2022  1:14 PM. If you have any questions, ask your nurse or doctor.          albuterol 108 (90 Base) MCG/ACT inhaler Commonly known as: VENTOLIN HFA Inhale 1-2 puffs into the lungs every 6 (six) hours as needed for wheezing or shortness of breath.   benzonatate 100 MG capsule Commonly known as: TESSALON Take 1 capsule (100 mg total) by mouth 3 (three) times daily as needed for cough.   famotidine 20 MG tablet Commonly known as: Pepcid Take 1 tablet (20 mg total) by mouth 2 (two) times daily as needed (hives). Started by: Larose Kells, MD   ferrous sulfate 325 (65 FE) MG tablet Take 1 tablet (325 mg total) by mouth 2 (two) times daily with a meal.   fexofenadine 180 MG tablet Commonly known as: Allegra Allergy Take 1 tablet (180  mg total) by mouth 2 (two) times daily as needed (hives). Started by: Larose Kells, MD   fluticasone 50 MCG/ACT nasal spray Commonly known as: FLONASE Place 2 sprays into both nostrils daily.   metFORMIN 500 MG 24 hr tablet Commonly known as: GLUCOPHAGE-XR Take 500 mg by mouth daily with breakfast.   Mirena (52 MG) 20 MCG/DAY Iud Generic drug: levonorgestrel 1 each by Intrauterine route once.         REVIEW OF SYSTEMS: Pertinent positives and negatives discussed in HPI.   Objective:   Physical Exam: BP 110/60   Pulse 86   Temp 98.1 F (36.7 C) (Temporal)   Resp 20   Ht 5'  0.43" (1.535 m)   Wt 179 lb 11.2 oz (81.5 kg)   SpO2 97%   BMI 34.59 kg/m  Body mass index is 34.59 kg/m. GEN: alert, well developed HEENT: clear conjunctiva, TM grey and translucent, nose with + inferior turbinate hypertrophy, pink nasal mucosa, slight clear rhinorrhea, no cobblestoning HEART: regular rate and rhythm, no murmur LUNGS: clear to auscultation bilaterally, no coughing, unlabored respiration ABDOMEN: soft, non distended  SKIN: + dermatographism  Reviewed:  08/24/2021: seen in ER for viral URI with cough and fever, no wheezing on exam, Flu/COVID negative. Given tessalon perles.   04/25/2021: seen in ER for SOB/dry cough with history of asthma; some wheezing present on lung exam. Given a prednisone taper, tessalon perles and albuterol PRN.   09/20/2017: seen in [facility] urgent care for cough, SOB, sore throat, runny nose with wheezing one exam; given prednisone pack, tessalon perles, albuterol   Spirometry:  Tracings reviewed. Her effort: Good reproducible efforts. FVC: 2.29L FEV1: 2.04L, 80% predicted FEV1/FVC ratio: 89% Interpretation: Spirometry consistent with normal pattern.  Please see scanned spirometry results for details.  Skin Testing:  Skin prick testing was placed, which includes aeroallergens/foods, histamine control, and saline control.  Verbal consent was obtained prior to placing test.  Patient tolerated procedure well.  Allergy testing results were read and interpreted by myself, documented by clinical staff. Adequate positive and negative control.  Results discussed with patient/family.  Airborne Adult Perc - 06/25/22 0902     Time Antigen Placed 0912    Allergen Manufacturer Lavella Hammock    Location Back    Number of Test 59    1. Control-Buffer 50% Glycerol Negative    2. Control-Histamine 1 mg/ml 3+    3. Albumin saline Negative    4. [place] 3+    5. [place] Negative    6. [place] 3+    7. [place] Blue Negative    8. [place] Omitted     9. Perennial Rye 3+    10. Sweet Vernal Negative    11. Timothy 3+    12. Cocklebur Negative    13. Burweed Marshelder Negative    14. Ragweed, short Negative    15. Ragweed, Giant 3+    16. Plantain,  English Negative    17. Lamb's Quarters Negative    18. Sheep Sorrell Negative    19. Rough Pigweed 2+    20. Marsh Elder, Rough Negative    21. Mugwort, Common Negative    22. Ash mix Negative    23. Birch mix Negative    24. Beech American Negative    25. Box, Elder 3+    26. Cedar, red Negative    27. Cottonwood, Eastern 3+    28. Elm mix Negative    29. Hickory Negative  30. Maple mix Negative    31. Oak, [place] mix 3+    32. Pecan Pollen 3+    33. Pine mix Negative    34. Sycamore Eastern Negative    35. [place], Black Pollen 3+    36. Alternaria alternata 3+    37. Cladosporium Herbarum Negative    38. Aspergillus mix Negative    39. Penicillium mix Negative    40. Bipolaris sorokiniana (Helminthosporium) Negative    41. Drechslera spicifera (Curvularia) 3+    42. Mucor plumbeus Negative    43. Fusarium moniliforme Negative    44. Aureobasidium pullulans (pullulara) Negative    45. Rhizopus oryzae Negative    46. Botrytis cinera Negative    47. Epicoccum nigrum 3+    48. Phoma betae Negative    49. Candida Albicans 3+    50. Trichophyton mentagrophytes Negative    51. Mite, D Farinae  5,000 AU/ml 3+    52. Mite, D Pteronyssinus  5,000 AU/ml Negative    53. Cat Hair 10,000 BAU/ml Negative    54.  Dog Epithelia Negative    55. Mixed Feathers Negative    56. Horse Epithelia Negative    57. Cockroach, German Negative    58. Mouse Negative    59. Tobacco Leaf Negative               Assessment:   1. Idiopathic urticaria   2. Mild intermittent asthma without complication   3. Seasonal and perennial allergic rhinitis   4. Allergic conjunctivitis of both eyes     Plan/Recommendations:  Chronic Idiopathic Urticaria (Hives)/Dermatographism,  Uncontrolled - Hives can be caused by a variety of different triggers including illness/infection, exercise, pressure, vibrations, extremes of temperature to name a few however majority of the time there is no identifiable trigger.  You also have dermatographism as a trigger. -If hives persist at next visit or if they don't respond to anti-histamines, we will obtain labs to rule out inflammatory/autoimmune/alpha gal/mast cell causes. -Start Allegra 180mg  daily.   -If no improvement in 7 days, increase to Allegra 180mg  twice daily.   -If no improvement in 7 days, add Pepcid 20mg  twice daily and continue Allegra 180mg  twice daily. -If still no improvement, increase to Allegra 360mg  twice daily and Pepcid 40mg  twice daily.  Allergic Rhinitis, Uncontrolled in Spring Allergic Conjunctivitis, Uncontrolled in Spring - Positive skin test 06/2022: grasses, trees, weeds, mold, dust mite  - Avoidance measures discussed. - Use nasal saline rinses before nose sprays such as with Neilmed Sinus Rinse.  Use distilled water.   - Use Azelastine 1-2 sprays each nostril twice daily as needed.  Aim upward and outward. - Use Allegra 180mg  daily as needed.  - For eyes, use Olopatadine or Ketotifen 1 eye drop daily as needed for itchy, watery eyes.  Available over the counter, if not covered by insurance.  - Consider allergy shots as long term control of your symptoms by teaching your immune system to be more tolerant of your allergy triggers   Mild Intermittent Asthma, Controlled - MDI technique discussed.  Normal spirometry today.  - Maintenance inhaler: none - Rescue inhaler: Albuterol 2 puffs via spacer or 1 vial via nebulizer every 4-6 hours as needed for respiratory symptoms of cough, shortness of breath, or wheezing Asthma control goals:  Full participation in all desired activities (may need albuterol before activity) Albuterol use two times or less a week on average (not counting use with activity) Cough  interfering with sleep  two times or less a month Oral steroids no more than once a year No hospitalizations   Return in about 6 weeks (around 08/06/2022).  Harlon Flor, MD [facility]

## 2022-08-06 ENCOUNTER — Ambulatory Visit: Payer: Managed Care, Other (non HMO) | Admitting: Internal Medicine

## 2024-04-09 ENCOUNTER — Ambulatory Visit: Admitting: Internal Medicine

## 2024-04-09 ENCOUNTER — Other Ambulatory Visit: Payer: Self-pay

## 2024-04-09 VITALS — BP 116/80 | HR 84 | Temp 98.0°F | Ht 63.5 in | Wt 186.5 lb

## 2024-04-09 DIAGNOSIS — J302 Other seasonal allergic rhinitis: Secondary | ICD-10-CM

## 2024-04-09 DIAGNOSIS — L501 Idiopathic urticaria: Secondary | ICD-10-CM | POA: Diagnosis not present

## 2024-04-09 DIAGNOSIS — J4530 Mild persistent asthma, uncomplicated: Secondary | ICD-10-CM

## 2024-04-09 DIAGNOSIS — J3089 Other allergic rhinitis: Secondary | ICD-10-CM | POA: Diagnosis not present

## 2024-04-09 MED ORDER — FLUTICASONE-SALMETEROL 250-50 MCG/ACT IN AEPB: 2.0000 | INHALATION_SPRAY | Freq: Two times a day (BID) | RESPIRATORY_TRACT | 5 refills | Status: AC

## 2024-04-09 MED ORDER — FLUTICASONE PROPIONATE 50 MCG/ACT NA SUSP: 2.0000 | Freq: Every day | NASAL | 5 refills | Status: AC

## 2024-04-09 MED ORDER — FEXOFENADINE HCL 180 MG PO TABS: 180.0000 mg | ORAL_TABLET | Freq: Two times a day (BID) | ORAL | 5 refills | Status: AC | PRN

## 2024-04-09 MED ORDER — ALBUTEROL SULFATE HFA 108 (90 BASE) MCG/ACT IN AERS: 1.0000 | INHALATION_SPRAY | Freq: Four times a day (QID) | RESPIRATORY_TRACT | 1 refills | Status: AC | PRN

## 2024-04-09 MED ORDER — AZELASTINE HCL 0.1 % NA SOLN: 1.0000 | Freq: Every evening | NASAL | 5 refills | Status: AC

## 2024-04-09 NOTE — Patient Instructions (Addendum)
 Cough - Use warm tea and honey. - Use peppermint atloid. - Avoid throat clearing.  Keep a bottle of water and instead take a sip.   Allergic Rhinoconjunctivitis - Positive skin test 06/2022: grasses, trees, weeds, mold, dust mite  - Use nasal saline rinses before nose sprays such as with Neilmed Sinus Rinse.  Use distilled water.   - Use Flonase  2 sprays each nostril daily.  Aim upward and outward.   - Use Azelastine  2 sprays each nostril nightly.  Aim upward and outward. - Use Allegra  180mg  daily. - For eyes, use Olopatadine  or Ketotifen 1 eye drop daily as needed for itchy, watery eyes.  Available over the counter, if not covered by insurance.  - Consider allergy  shots as long term control of your symptoms by teaching your immune system to be more tolerant of your allergy  triggers   Mild Persistent Asthma: - Maintenance inhaler: continue Advair 250-50mcg 2 puffs twice daily.  - Rescue inhaler: Albuterol  2 puffs via spacer or 1 vial via nebulizer every 4-6 hours as needed for respiratory symptoms of cough, shortness of breath, or wheezing Asthma control goals:  Full participation in all desired activities (may need albuterol  before activity) Albuterol  use two times or less a week on average (not counting use with activity) Cough interfering with sleep two times or less a month Oral steroids no more than once a year No hospitalizations  Chronic Idiopathic Urticaria (Hives)/Dermatographism:  - At this time etiology of hives and swelling is unknown. Hives can be caused by a variety of different triggers including illness/infection, exercise, pressure, vibrations, extremes of temperature to name a few however majority of the time there is no identifiable trigger.  You also have dermatographism as a trigger. -Use Allegra  180mg  daily.   -If no improvement in 7 days, increase to Allegra  180mg  twice daily.   -If no improvement in 7 days, add Pepcid  20mg  twice daily and continue Allegra  180mg   twice daily.

## 2024-04-09 NOTE — Progress Notes (Signed)
 FOLLOW UP Date of Service/Encounter:  04/09/24   Subjective:  Teresa Brown (DOB: [birth year]) is a 28 y.o. female who [facility] for follow up for cough, allergic rhinitis, asthma, hives.   History obtained from: chart review and patient. Last visit was with me on 06/25/2022 and at the time, we discussed use of Allegra /Pepcid  for hives, PRN Azelastine  for allergies, PRN Albuterol  for asthma.  Reports asthma was doing well until she got sick around February and since then has had a persistent cough. She has been on steroids/antibiotics without relief.  Feels mucous/tickle in the back of the throat and has a dry short cough.  Was also given Advair which helped a little but still has not resolved the cough.  Not using any allergy  medications, does note some mucous drainage.  No issues with hives.   Past Medical History: Past Medical History:  Diagnosis Date   Asthma    Gestational diabetes     Objective:  BP 116/80 (BP Location: Right Arm, Patient Position: Sitting, Cuff Size: Normal)   Pulse 84   Temp 98 F (36.7 C) (Temporal)   Ht 5' 3.5 (1.613 m)   Wt 186 lb 8 oz (84.6 kg)   SpO2 98%   BMI 32.52 kg/m  Body mass index is 32.52 kg/m. Physical Exam: GEN: alert, well developed HEENT: clear conjunctiva, nose with mild inferior turbinate hypertrophy, pink nasal mucosa, slight clear rhinorrhea, + cobblestoning HEART: regular rate and rhythm, no murmur LUNGS: clear to auscultation bilaterally, no coughing, unlabored respiration SKIN: no rashes or lesions  Spirometry:  Tracings reviewed. Her effort: Good reproducible efforts.. FVC: 2.44L, 85% predicted  FEV1: 2.10L, 83% predicted FEV1/FVC ratio: 86% Interpretation: Spirometry consistent with normal pattern.  Please see scanned spirometry results for details.  Assessment:   1. Seasonal and perennial allergic rhinitis   2. Idiopathic urticaria   3. Mild persistent asthma without  complication     Plan/Recommendations:  Cough - Possibly combination of habit cough/post infectious/post nasal drainage.  Restart rhinitis medications as discussed below.  For now continue the ICS/LABA inhaler; although with normal spirometry and lack of significant response, low suspicion asthma is the true cause for the cough.  - Use warm tea and honey. - Use peppermint atloid. - Avoid throat clearing.  Keep a bottle of water and instead take a sip.   Allergic Rhinoconjunctivitis - Uncontrolled.  - Positive skin test 06/2022: grasses, trees, weeds, mold, dust mite  - Use nasal saline rinses before nose sprays such as with Neilmed Sinus Rinse.  Use distilled water.   - Use Flonase  2 sprays each nostril daily.  Aim upward and outward.   - Use Azelastine  2 sprays each nostril nightly.  Aim upward and outward. - Use Allegra  180mg  daily. - For eyes, use Olopatadine  or Ketotifen 1 eye drop daily as needed for itchy, watery eyes.  Available over the counter, if not covered by insurance.  - Consider allergy  shots as long term control of your symptoms by teaching your immune system to be more tolerant of your allergy  triggers   Mild Persistent Asthma: - Controlled with normal spirometry today.  - Maintenance inhaler: continue Advair 250-50mcg 2 puffs twice daily.  - Rescue inhaler: Albuterol  2 puffs via spacer or 1 vial via nebulizer every 4-6 hours as needed for respiratory symptoms of cough, shortness of breath, or wheezing Asthma control goals:  Full participation in all desired activities (may need albuterol  before activity) Albuterol   use two times or less a week on average (not counting use with activity) Cough interfering with sleep two times or less a month Oral steroids no more than once a year No hospitalizations  Chronic Idiopathic Urticaria (Hives)/Dermatographism:  - Controlled - At this time etiology of hives and swelling is unknown. Hives can be caused by a variety of  different triggers including illness/infection, exercise, pressure, vibrations, extremes of temperature to name a few however majority of the time there is no identifiable trigger.  You also have dermatographism as a trigger. -Use Allegra  180mg  daily.   -If no improvement in 7 days, increase to Allegra  180mg  twice daily.   -If no improvement in 7 days, add Pepcid  20mg  twice daily and continue Allegra  180mg  twice daily.     Return in about 3 months (around 07/10/2024).  Arleta Blanch, MD [facility] 

## 2024-07-09 ENCOUNTER — Ambulatory Visit: Admitting: Internal Medicine

## 2024-08-20 ENCOUNTER — Ambulatory Visit: Payer: Self-pay
